# Patient Record
Sex: Female | Born: 1965 | Race: White | Hispanic: No | Marital: Single | Smoking: Never smoker
Health system: Southern US, Community
[De-identification: ages and names within clinical notes are randomized; demographics above are authoritative.]

## PROBLEM LIST (undated history)

## (undated) DIAGNOSIS — E039 Hypothyroidism, unspecified: Secondary | ICD-10-CM

## (undated) DIAGNOSIS — E785 Hyperlipidemia, unspecified: Secondary | ICD-10-CM

## (undated) DIAGNOSIS — T7840XA Allergy, unspecified, initial encounter: Secondary | ICD-10-CM

## (undated) DIAGNOSIS — Z86010 Personal history of colonic polyps: Principal | ICD-10-CM

## (undated) HISTORY — DX: Personal history of colonic polyps: Z86.010

## (undated) HISTORY — DX: Hyperlipidemia, unspecified: E78.5

## (undated) HISTORY — DX: Hypothyroidism, unspecified: E03.9

## (undated) HISTORY — DX: Allergy, unspecified, initial encounter: T78.40XA

## (undated) HISTORY — PX: OOPHORECTOMY: SHX86

## (undated) HISTORY — PX: APPENDECTOMY: SHX54

## (undated) HISTORY — PX: CHOLECYSTECTOMY: SHX55

## (undated) HISTORY — PX: COLONOSCOPY: SHX174

---

## 1987-01-15 HISTORY — PX: OOPHORECTOMY: SHX86

## 2000-08-04 ENCOUNTER — Other Ambulatory Visit: Admission: RE | Admit: 2000-08-04 | Discharge: 2000-08-04 | Payer: Self-pay | Admitting: Obstetrics and Gynecology

## 2004-11-12 ENCOUNTER — Encounter: Admission: RE | Admit: 2004-11-12 | Discharge: 2004-11-12 | Payer: Self-pay | Admitting: Internal Medicine

## 2005-12-20 ENCOUNTER — Encounter: Admission: RE | Admit: 2005-12-20 | Discharge: 2005-12-20 | Payer: Self-pay | Admitting: Internal Medicine

## 2005-12-31 ENCOUNTER — Encounter: Admission: RE | Admit: 2005-12-31 | Discharge: 2005-12-31 | Payer: Self-pay | Admitting: Internal Medicine

## 2006-07-22 ENCOUNTER — Encounter: Admission: RE | Admit: 2006-07-22 | Discharge: 2006-07-22 | Payer: Self-pay | Admitting: Internal Medicine

## 2006-11-21 ENCOUNTER — Ambulatory Visit: Payer: Self-pay | Admitting: Internal Medicine

## 2006-12-03 ENCOUNTER — Ambulatory Visit: Payer: Self-pay | Admitting: Internal Medicine

## 2007-02-20 ENCOUNTER — Encounter: Admission: RE | Admit: 2007-02-20 | Discharge: 2007-02-20 | Payer: Self-pay | Admitting: Internal Medicine

## 2009-01-19 ENCOUNTER — Encounter: Admission: RE | Admit: 2009-01-19 | Discharge: 2009-01-19 | Payer: Self-pay | Admitting: Internal Medicine

## 2009-03-30 ENCOUNTER — Ambulatory Visit: Payer: Self-pay | Admitting: Oncology

## 2009-04-14 LAB — COMPREHENSIVE METABOLIC PANEL
AST: 23 U/L (ref 0–37)
Alkaline Phosphatase: 66 U/L (ref 39–117)
BUN: 16 mg/dL (ref 6–23)
Glucose, Bld: 88 mg/dL (ref 70–99)
Sodium: 139 mEq/L (ref 135–145)
Total Bilirubin: 0.4 mg/dL (ref 0.3–1.2)

## 2009-04-14 LAB — CBC WITH DIFFERENTIAL/PLATELET
Basophils Absolute: 0 10*3/uL (ref 0.0–0.1)
EOS%: 4.2 % (ref 0.0–7.0)
Eosinophils Absolute: 0.2 10*3/uL (ref 0.0–0.5)
HGB: 12.9 g/dL (ref 11.6–15.9)
MCV: 90 fL (ref 79.5–101.0)
MONO%: 8.5 % (ref 0.0–14.0)
NEUT#: 2.4 10*3/uL (ref 1.5–6.5)
RBC: 4.09 10*6/uL (ref 3.70–5.45)
RDW: 11.9 % (ref 11.2–14.5)
WBC: 4.1 10*3/uL (ref 3.9–10.3)
lymph#: 1.2 10*3/uL (ref 0.9–3.3)

## 2009-04-14 LAB — MORPHOLOGY
PLT EST: ADEQUATE
RBC Comments: NORMAL

## 2009-04-17 ENCOUNTER — Telehealth (INDEPENDENT_AMBULATORY_CARE_PROVIDER_SITE_OTHER): Payer: Self-pay | Admitting: *Deleted

## 2009-04-17 LAB — HEPATITIS B SURFACE ANTIGEN: Hepatitis B Surface Ag: NEGATIVE

## 2009-04-17 LAB — TSH: TSH: 4.043 u[IU]/mL (ref 0.350–4.500)

## 2009-04-17 LAB — HEPATITIS B SURFACE ANTIBODY,QUALITATIVE: Hep B S Ab: POSITIVE — AB

## 2009-04-17 LAB — VITAMIN B12: Vitamin B-12: 408 pg/mL (ref 211–911)

## 2009-04-17 LAB — HEPATITIS B CORE ANTIBODY, TOTAL: Hep B Core Total Ab: NEGATIVE

## 2009-06-08 ENCOUNTER — Ambulatory Visit: Payer: Self-pay | Admitting: Oncology

## 2009-06-09 LAB — CBC WITH DIFFERENTIAL/PLATELET
BASO%: 0.9 % (ref 0.0–2.0)
EOS%: 3.2 % (ref 0.0–7.0)
HCT: 33.7 % — ABNORMAL LOW (ref 34.8–46.6)
MCH: 31.8 pg (ref 25.1–34.0)
MCHC: 35.8 g/dL (ref 31.5–36.0)
MONO#: 0.4 10*3/uL (ref 0.1–0.9)
NEUT%: 55.9 % (ref 38.4–76.8)
RBC: 3.79 10*6/uL (ref 3.70–5.45)
RDW: 12.1 % (ref 11.2–14.5)
WBC: 3.8 10*3/uL — ABNORMAL LOW (ref 3.9–10.3)
lymph#: 1.1 10*3/uL (ref 0.9–3.3)

## 2009-06-09 LAB — CHCC SMEAR

## 2009-08-02 ENCOUNTER — Ambulatory Visit: Payer: Self-pay | Admitting: Oncology

## 2009-08-04 LAB — CBC WITH DIFFERENTIAL/PLATELET
BASO%: 0.2 % (ref 0.0–2.0)
EOS%: 2.3 % (ref 0.0–7.0)
Eosinophils Absolute: 0.1 10*3/uL (ref 0.0–0.5)
LYMPH%: 26.8 % (ref 14.0–49.7)
MCHC: 35.8 g/dL (ref 31.5–36.0)
MCV: 89 fL (ref 79.5–101.0)
MONO%: 10.8 % (ref 0.0–14.0)
NEUT#: 2.2 10*3/uL (ref 1.5–6.5)
Platelets: 247 10*3/uL (ref 145–400)
RBC: 3.95 10*6/uL (ref 3.70–5.45)
RDW: 11.9 % (ref 11.2–14.5)

## 2009-08-04 LAB — CHCC SMEAR

## 2009-09-27 ENCOUNTER — Ambulatory Visit: Payer: Self-pay | Admitting: Oncology

## 2010-01-24 ENCOUNTER — Ambulatory Visit: Payer: Self-pay | Admitting: Oncology

## 2010-01-25 ENCOUNTER — Encounter
Admission: RE | Admit: 2010-01-25 | Discharge: 2010-01-25 | Payer: Self-pay | Source: Home / Self Care | Attending: Internal Medicine | Admitting: Internal Medicine

## 2010-01-26 LAB — CBC WITH DIFFERENTIAL/PLATELET
BASO%: 0.4 % (ref 0.0–2.0)
Basophils Absolute: 0 10*3/uL (ref 0.0–0.1)
EOS%: 2 % (ref 0.0–7.0)
Eosinophils Absolute: 0.1 10*3/uL (ref 0.0–0.5)
HCT: 35.1 % (ref 34.8–46.6)
HGB: 12.3 g/dL (ref 11.6–15.9)
LYMPH%: 24.7 % (ref 14.0–49.7)
MCH: 31.1 pg (ref 25.1–34.0)
MCHC: 34.9 g/dL (ref 31.5–36.0)
MCV: 89.3 fL (ref 79.5–101.0)
MONO#: 0.4 10*3/uL (ref 0.1–0.9)
MONO%: 7.9 % (ref 0.0–14.0)
NEUT#: 2.9 10*3/uL (ref 1.5–6.5)
NEUT%: 65 % (ref 38.4–76.8)
Platelets: 253 10*3/uL (ref 145–400)
RBC: 3.93 10*6/uL (ref 3.70–5.45)
RDW: 11.9 % (ref 11.2–14.5)
WBC: 4.5 10*3/uL (ref 3.9–10.3)
lymph#: 1.1 10*3/uL (ref 0.9–3.3)

## 2010-01-26 LAB — COMPREHENSIVE METABOLIC PANEL
ALT: 11 U/L (ref 0–35)
AST: 19 U/L (ref 0–37)
Albumin: 4.7 g/dL (ref 3.5–5.2)
Alkaline Phosphatase: 63 U/L (ref 39–117)
BUN: 22 mg/dL (ref 6–23)
CO2: 24 mEq/L (ref 19–32)
Calcium: 9.8 mg/dL (ref 8.4–10.5)
Chloride: 103 mEq/L (ref 96–112)
Creatinine, Ser: 0.76 mg/dL (ref 0.40–1.20)
Glucose, Bld: 88 mg/dL (ref 70–99)
Potassium: 4.2 mEq/L (ref 3.5–5.3)
Sodium: 137 mEq/L (ref 135–145)
Total Bilirubin: 0.4 mg/dL (ref 0.3–1.2)
Total Protein: 7.7 g/dL (ref 6.0–8.3)

## 2010-01-26 LAB — MORPHOLOGY: PLT EST: ADEQUATE

## 2010-01-26 LAB — CHCC SMEAR

## 2010-02-04 ENCOUNTER — Encounter: Payer: Self-pay | Admitting: Internal Medicine

## 2010-02-15 NOTE — Progress Notes (Signed)
  Phone Note Other Incoming   Request: Send information Summary of Call: Request received from [facility] forwarded to [facility].

## 2010-07-31 ENCOUNTER — Encounter (HOSPITAL_BASED_OUTPATIENT_CLINIC_OR_DEPARTMENT_OTHER): Payer: 59 | Admitting: Oncology

## 2010-07-31 ENCOUNTER — Other Ambulatory Visit: Payer: Self-pay | Admitting: Oncology

## 2010-07-31 DIAGNOSIS — D72819 Decreased white blood cell count, unspecified: Secondary | ICD-10-CM

## 2010-07-31 LAB — CBC WITH DIFFERENTIAL/PLATELET
Basophils Absolute: 0 10*3/uL (ref 0.0–0.1)
EOS%: 3 % (ref 0.0–7.0)
HCT: 34.6 % — ABNORMAL LOW (ref 34.8–46.6)
HGB: 12.1 g/dL (ref 11.6–15.9)
LYMPH%: 22 % (ref 14.0–49.7)
MCH: 31.2 pg (ref 25.1–34.0)
MCV: 89.6 fL (ref 79.5–101.0)
MONO%: 6.6 % (ref 0.0–14.0)
NEUT%: 67.8 % (ref 38.4–76.8)
Platelets: 238 10*3/uL (ref 145–400)

## 2011-01-18 ENCOUNTER — Other Ambulatory Visit: Payer: Self-pay | Admitting: Internal Medicine

## 2011-01-18 DIAGNOSIS — Z1231 Encounter for screening mammogram for malignant neoplasm of breast: Secondary | ICD-10-CM

## 2011-01-25 ENCOUNTER — Encounter: Payer: Self-pay | Admitting: Oncology

## 2011-01-25 ENCOUNTER — Ambulatory Visit (HOSPITAL_BASED_OUTPATIENT_CLINIC_OR_DEPARTMENT_OTHER): Payer: 59 | Admitting: Oncology

## 2011-01-25 ENCOUNTER — Other Ambulatory Visit: Payer: Self-pay | Admitting: Oncology

## 2011-01-25 ENCOUNTER — Other Ambulatory Visit (HOSPITAL_BASED_OUTPATIENT_CLINIC_OR_DEPARTMENT_OTHER): Payer: 59 | Admitting: Lab

## 2011-01-25 ENCOUNTER — Telehealth: Payer: Self-pay | Admitting: Oncology

## 2011-01-25 DIAGNOSIS — E039 Hypothyroidism, unspecified: Secondary | ICD-10-CM

## 2011-01-25 DIAGNOSIS — D72819 Decreased white blood cell count, unspecified: Secondary | ICD-10-CM

## 2011-01-25 DIAGNOSIS — Z862 Personal history of diseases of the blood and blood-forming organs and certain disorders involving the immune mechanism: Secondary | ICD-10-CM

## 2011-01-25 DIAGNOSIS — Z800 Family history of malignant neoplasm of digestive organs: Secondary | ICD-10-CM

## 2011-01-25 LAB — COMPREHENSIVE METABOLIC PANEL
Albumin: 4.3 g/dL (ref 3.5–5.2)
Alkaline Phosphatase: 69 U/L (ref 39–117)
BUN: 18 mg/dL (ref 6–23)
Calcium: 9.5 mg/dL (ref 8.4–10.5)
Glucose, Bld: 94 mg/dL (ref 70–99)
Potassium: 3.9 mEq/L (ref 3.5–5.3)

## 2011-01-25 LAB — CBC WITH DIFFERENTIAL/PLATELET
Basophils Absolute: 0 10*3/uL (ref 0.0–0.1)
EOS%: 2.4 % (ref 0.0–7.0)
HGB: 12.5 g/dL (ref 11.6–15.9)
MCH: 31.1 pg (ref 25.1–34.0)
MCHC: 34.9 g/dL (ref 31.5–36.0)
MCV: 89.2 fL (ref 79.5–101.0)
MONO%: 7.3 % (ref 0.0–14.0)
RBC: 4.01 10*6/uL (ref 3.70–5.45)
RDW: 12.4 % (ref 11.2–14.5)

## 2011-01-25 LAB — MORPHOLOGY

## 2011-01-25 NOTE — Telephone Encounter (Signed)
gve the referral for the genteic testing appt to [facility]

## 2011-01-25 NOTE — Progress Notes (Signed)
[facility] OFFICE PROGRESS NOTE  Cc:  Tally Due, MD, MD  DIAGNOSIS:   Intermittent leucopenia, NOS.  CURRENT THERAPY:  watchful observation.  INTERVAL HISTORY: Deborah Ramirez 46 y.o. female returns for regular follow up by herself.  With respect to her history of leukocytopenia, she is doing well.  She denies fever, night sweat, weight loss, bleeding symptoms, recurrent infection.  She works full time without fatigue.  Her younger sister died at age 19 from gastric cancer.  Her grandmother died at age 54 from colon cancer.  She inquires about her risk.  She eats very healthy, vegetable/fruit diet.  She does not eat much grilled or canned meat.  She denies abdominal pain, nausea/vomiting, hematemasis, hematochezia, melena, weight loss.   MEDICAL HISTORY: Past Medical History  Diagnosis Date  . Fibromyalgia   . Hyperlipidemia   . Endometriosis 1990  . Hypothyroidism   . Vitamin D deficiency     SURGICAL HISTORY: No past surgical history on file.  MEDICATIONS: Current Outpatient Prescriptions  Medication Sig Dispense Refill  . levothyroxine (SYNTHROID, LEVOTHROID) 75 MCG tablet Take 75 mcg by mouth daily.        ALLERGIES:   has no known allergies.  REVIEW OF SYSTEMS:  The rest of the 14-point review of system was negative.   Filed Vitals:   01/25/11 1517  BP: 120/75  Pulse: 69  Temp: 98.2 F (36.8 C)   Wt Readings from Last 3 Encounters:  01/25/11 157 lb (71.215 kg)   ECOG Performance status:   PHYSICAL EXAMINATION:   General:  well-nourished in no acute distress.  Eyes:  no scleral icterus.  ENT:  There were no oropharyngeal lesions.  Neck was without thyromegaly.  Lymphatics:  Negative cervical, supraclavicular or axillary adenopathy.  Respiratory: lungs were clear bilaterally without wheezing or crackles.  Cardiovascular:  Regular rate and rhythm, S1/S2, without murmur, rub or gallop.  There was no pedal edema.  GI:  abdomen was soft, flat,  nontender, nondistended, without organomegaly.  Muscoloskeletal:  no spinal tenderness of palpation of vertebral spine.  Skin exam was without echymosis, petichae.  Neuro exam was nonfocal.  Patient was able to get on and off exam table without assistance.  Gait was normal.  Patient was alerted and oriented.  Attention was good.   Language was appropriate.  Mood was normal without depression.  Speech was not pressured.  Thought content was not tangential.    LABORATORY/RADIOLOGY DATA:  Lab Results  Component Value Date   WBC 4.3 01/25/2011   HGB 12.5 01/25/2011   HCT 35.7 01/25/2011   PLT 278 01/25/2011   GLUCOSE 94 01/25/2011   ALT 29 01/25/2011   AST 24 01/25/2011   NA 139 01/25/2011   K 3.9 01/25/2011   CL 103 01/25/2011   CREATININE 0.75 01/25/2011   BUN 18 01/25/2011   CO2 26 01/25/2011   TSH 4.043 04/14/2009     I personally reviewed the patient's peripheral blood smear today.  There was isocytosis.  There was no peripheral blast.  There was no schistocytosis, spherocytosis, target cell, rouleaux formation, tear drop cell.  There was no giant platelets or platelet clumps.     ASSESSMENT AND PLAN:   1. History of leukocytopenia:  Possibly related to her hypothyroidism.  With correction of this issue, her leukocytopenia has resolved.  I have low clinical concern for a primary bone marrow failure process.  She does not have any cytopenia.  My review of her peripheral  blood smear was quite normal today.  I advised her to follow up with her PCP at least twice a year.  In the future, if her cytopenia recurs, I may consider further work up.  2. Hypothyroidism:  She is on levothyroxine per PCP.   3. Family history with sister with gastric CA and grandmother with colon CA.  There was no other family history of cancer.  My opinion is that these two relatives have sporadic cancer as opposed to hereditary syndrome.  I reviewed the Bethesda guidelines and they do not meet criteria for Lynch syndrome.   However, this guideline is quite stringent and that sometimes cases may be missed.  The concern I have is that her sister died from gastric cancer at age 76.  I thus recommended a referral to Genetic Counselor clinic to see whether patient should be screened for Lynch syndrome.  Unfortunately, the two index cases in her family had passed away and did not have testing which would have been ideal.  Patient herself has had screening colonoscopy in the past which was negative.  I advised her about red flag symptoms for gastric cancer including abdominal pain, nonintentional weight loss, hematemasis, melena, hematochezia which she all declined at this time.  I discussed with her that there is no indication for screening EGD for gastric cancer unless she has symptoms.  She expressed understanding.    The length of time of the face-to-face encounter was 20 minutes. More than 50% of time was spent counseling and coordination of care.

## 2011-01-29 ENCOUNTER — Ambulatory Visit
Admission: RE | Admit: 2011-01-29 | Discharge: 2011-01-29 | Disposition: A | Discharge status: Home / Self Care | Payer: 59 | Source: Ambulatory Visit | Attending: Internal Medicine | Admitting: Internal Medicine

## 2011-01-29 ENCOUNTER — Telehealth: Payer: Self-pay | Admitting: Oncology

## 2011-01-29 DIAGNOSIS — Z1231 Encounter for screening mammogram for malignant neoplasm of breast: Secondary | ICD-10-CM

## 2011-01-29 NOTE — Telephone Encounter (Signed)
Received copy of genetics referral from [facility] to schedule appt for pt. S/w pt today re appt for 1/18 @ 11 am.

## 2011-02-22 ENCOUNTER — Ambulatory Visit: Payer: 59

## 2011-02-22 NOTE — Progress Notes (Signed)
Pt seen for genetic counseling.  Blood drawn for Lynch syndrome

## 2011-02-26 ENCOUNTER — Other Ambulatory Visit: Payer: 59 | Admitting: Lab

## 2011-02-26 ENCOUNTER — Ambulatory Visit: Payer: 59

## 2011-04-08 ENCOUNTER — Ambulatory Visit (INDEPENDENT_AMBULATORY_CARE_PROVIDER_SITE_OTHER): Payer: 59 | Admitting: Physician Assistant

## 2011-04-08 VITALS — BP 134/87 | HR 69 | Temp 98.3°F | Resp 16 | Ht 60.0 in | Wt 144.0 lb

## 2011-04-08 DIAGNOSIS — Z800 Family history of malignant neoplasm of digestive organs: Secondary | ICD-10-CM

## 2011-04-08 DIAGNOSIS — E039 Hypothyroidism, unspecified: Secondary | ICD-10-CM

## 2011-04-08 MED ORDER — LEVOTHYROXINE SODIUM 75 MCG PO TABS: 75.0000 ug | ORAL_TABLET | Freq: Every day | ORAL | Status: DC

## 2011-04-08 NOTE — Progress Notes (Signed)
  Subjective:    Patient ID: Deborah Ramirez, female    DOB: 05-13-1965, 45 y.o.   MRN: [id number]  HPI Littie is here today for regular recheck hypothyroidism.  She has no concerns today.  Had annual Theatre stage manager PE and brings results with her today.  Cholesterol has increased since last year.  She has not changed her diet or exercise and watches both things closely.  Saw Hematology for leukocytosis.  No treatment advised.  He felt this was due to hypothyroidism.  TSH January 2013 was slightly elevated at 6.950.  We will repeat today.   Review of Systems  Constitutional: Negative for activity change, appetite change and unexpected weight change.  Respiratory: Negative for chest tightness and shortness of breath.   Cardiovascular: Negative for chest pain and palpitations.  Gastrointestinal: Negative for vomiting.  Musculoskeletal: Negative for myalgias.  Neurological: Negative for dizziness, light-headedness and headaches.       Objective:   Physical Exam  Constitutional: She is oriented to person, place, and time. She appears well-developed and well-nourished.  Neck: Neck supple. No thyromegaly present.  Cardiovascular: Normal rate and regular rhythm.   Neurological: She is alert and oriented to person, place, and time.        Assessment & Plan:  Hypothyroidism  Repeat TSH from January 2013.  Refill Synthroid 75 mcg 3 mo supply with 3 RF.  Recheck annually or if any changes.  Anticipatory guidance.

## 2011-06-20 ENCOUNTER — Ambulatory Visit (INDEPENDENT_AMBULATORY_CARE_PROVIDER_SITE_OTHER): Payer: 59 | Admitting: Physician Assistant

## 2011-06-20 ENCOUNTER — Ambulatory Visit: Payer: 59

## 2011-06-20 VITALS — BP 112/73 | HR 67 | Temp 98.5°F | Resp 16 | Ht 60.0 in | Wt 140.0 lb

## 2011-06-20 DIAGNOSIS — S6990XA Unspecified injury of unspecified wrist, hand and finger(s), initial encounter: Secondary | ICD-10-CM

## 2011-06-20 DIAGNOSIS — S6980XA Other specified injuries of unspecified wrist, hand and finger(s), initial encounter: Secondary | ICD-10-CM

## 2011-06-20 DIAGNOSIS — S6000XA Contusion of unspecified finger without damage to nail, initial encounter: Secondary | ICD-10-CM

## 2011-06-20 NOTE — Progress Notes (Signed)
  Subjective:    Patient ID: Deborah Ramirez, female    DOB: 22-Feb-1965, 46 y.o.   MRN: [id number]  HPI Iara comes in tonight with injury to left index after hitting it with a hammer 3 days ago.  She has had pain and bruising.  Some distal numbness but it is improving.     Review of Systems As noted in HPI    Objective:   Physical Exam  Constitutional: She is oriented to person, place, and time. Vital signs are normal.  Musculoskeletal:       Hands:      Left 2nd phalanx with mid to distal ecchymosis noted dorsal and ulnar aspect of digit.  Decreased flexion from swelling.  Tender at DIP and mid phalanx.  NV intact  Neurological: She is alert and oriented to person, place, and time.  Skin: Ecchymosis noted.    [facility] reading (PRIMARY) by  Dr. Hal Hope.  Left hand,  2nd phalanx No fx     Assessment & Plan:  Contusion, 2nd phalanx of left hand  She has a long fold over splint she has been using with Coban that she can continue to use for comfort.  Continue to Ice and take Advil prn.  RTC if no improvement 1 week.

## 2011-06-21 NOTE — Progress Notes (Unsigned)
Erroneous encounter

## 2011-06-23 ENCOUNTER — Telehealth: Payer: Self-pay | Admitting: Radiology

## 2011-06-23 NOTE — Telephone Encounter (Signed)
Notified pt of msg.

## 2011-06-23 NOTE — Telephone Encounter (Signed)
Message copied by Luretha Murphy on Sun Jun 23, 2011 10:06 AM ------      Message from: Pattricia Boss      Created: Fri Jun 21, 2011  1:55 PM       Call and notify that the radiology agreed that her finger is not fractured.

## 2011-07-24 ENCOUNTER — Other Ambulatory Visit: Payer: 59

## 2011-08-19 ENCOUNTER — Ambulatory Visit: Payer: 59

## 2011-08-23 ENCOUNTER — Ambulatory Visit (INDEPENDENT_AMBULATORY_CARE_PROVIDER_SITE_OTHER): Payer: 59 | Admitting: Physician Assistant

## 2011-08-23 VITALS — BP 111/69 | HR 65 | Temp 98.1°F | Resp 16 | Ht 60.0 in | Wt 138.2 lb

## 2011-08-23 DIAGNOSIS — Z01419 Encounter for gynecological examination (general) (routine) without abnormal findings: Secondary | ICD-10-CM

## 2011-08-23 DIAGNOSIS — E039 Hypothyroidism, unspecified: Secondary | ICD-10-CM

## 2011-08-23 DIAGNOSIS — E785 Hyperlipidemia, unspecified: Secondary | ICD-10-CM

## 2011-08-23 DIAGNOSIS — D72819 Decreased white blood cell count, unspecified: Secondary | ICD-10-CM

## 2011-08-23 NOTE — Progress Notes (Signed)
  Subjective:    Patient ID: Deborah Ramirez, female    DOB: 1965/10/28, 46 y.o.   MRN: [id number]  HPI Deborah Ramirez comes in tonight for her annual GYN exam.  She has no concerns.   She had her MMG in January 2013 and was normal. Her last pap has been > than 3 years. She lives with her long time partner, [name], and has never been pregnant.  She is not sexually active with men.  She performs SBE when she remembers.   She had PE with labs Jan 2013 for [employer].  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       Review of Systems  Constitutional: Negative.   Respiratory: Negative.   Cardiovascular: Negative.   Genitourinary: Negative.   Skin: Negative.   Hematological: Negative.   Psychiatric/Behavioral: Negative.        Objective:   Physical Exam  Constitutional: She is oriented to person, place, and time. Vital signs are normal. She appears well-developed and well-nourished.  Neck: No mass and no thyromegaly present.  Cardiovascular: Normal rate and regular rhythm.   Pulmonary/Chest: Effort normal and breath sounds normal.  Abdominal: Normal appearance and bowel sounds are normal. There is no tenderness.  Genitourinary: Vagina normal and uterus normal. No breast swelling, tenderness, discharge or bleeding. Cervix exhibits no motion tenderness. Right adnexum displays no tenderness. Left adnexum displays no tenderness.  Lymphadenopathy:    She has no cervical adenopathy.    She has no axillary adenopathy.  Neurological: She is alert and oriented to person, place, and time.  Skin: Skin is warm. No rash noted.  Psychiatric: She has a normal mood and affect.        Assessment & Plan:  Annual GYN Exam Hypothyroidism Hyperlipidemia Leukopenia  Pap # 2 Recommend she  return when fasting to repeat Lipids to monitor progress of diet changes (office visit not necessary). Will enter order for lipid panel. Anticipatory guidance.

## 2011-08-25 ENCOUNTER — Encounter: Payer: Self-pay | Admitting: Physician Assistant

## 2011-08-25 ENCOUNTER — Other Ambulatory Visit (INDEPENDENT_AMBULATORY_CARE_PROVIDER_SITE_OTHER): Payer: 59 | Admitting: Physician Assistant

## 2011-08-25 VITALS — BP 109/69 | HR 65 | Resp 16

## 2011-08-25 DIAGNOSIS — E785 Hyperlipidemia, unspecified: Secondary | ICD-10-CM

## 2011-08-25 DIAGNOSIS — I10 Essential (primary) hypertension: Secondary | ICD-10-CM

## 2011-08-25 DIAGNOSIS — D72819 Decreased white blood cell count, unspecified: Secondary | ICD-10-CM | POA: Insufficient documentation

## 2011-08-25 DIAGNOSIS — E039 Hypothyroidism, unspecified: Secondary | ICD-10-CM | POA: Insufficient documentation

## 2011-08-25 LAB — LIPID PANEL
LDL Cholesterol: 113 mg/dL — ABNORMAL HIGH (ref 0–99)
VLDL: 14 mg/dL (ref 0–40)

## 2011-08-26 LAB — PAP IG W/ RFLX HPV ASCU

## 2011-10-25 ENCOUNTER — Telehealth: Payer: Self-pay | Admitting: Internal Medicine

## 2011-10-28 NOTE — Telephone Encounter (Signed)
I do not think I have any info on this patient - is there a chart - paper records? Is she my patient?

## 2011-10-28 NOTE — Telephone Encounter (Signed)
Pt had colon with Dr. Leone Payor on 12/03/06. Chart has been sent off-site.

## 2011-10-29 NOTE — Telephone Encounter (Signed)
Patient had a normal colonoscopy 12/03/2006. She has a history according to the report of family history of colon cancer in a grandparent.  I have left a message for the patient to call back to discuss.

## 2011-10-30 NOTE — Telephone Encounter (Signed)
Patient reports that her sister passed away of gastric CA in her 61's and a grandparent with colon CA.  She has no current problems.  She wanted her chart reviewed to see if she needs to have colon earlier.  I will place a copy of the colon in your office.

## 2011-10-31 NOTE — Telephone Encounter (Signed)
No change in recall needed

## 2011-10-31 NOTE — Telephone Encounter (Signed)
I have left a detailed message for the patient with colonoscopy recall information.  I have asked that she call back for any additional questions or concerns

## 2011-11-05 ENCOUNTER — Encounter: Payer: Self-pay | Admitting: Internal Medicine

## 2012-03-13 ENCOUNTER — Other Ambulatory Visit: Payer: Self-pay

## 2012-03-13 DIAGNOSIS — Z1231 Encounter for screening mammogram for malignant neoplasm of breast: Secondary | ICD-10-CM

## 2012-04-08 ENCOUNTER — Ambulatory Visit
Admission: RE | Admit: 2012-04-08 | Discharge: 2012-04-08 | Disposition: A | Discharge status: Home / Self Care | Payer: 59 | Source: Ambulatory Visit

## 2012-04-08 DIAGNOSIS — Z1231 Encounter for screening mammogram for malignant neoplasm of breast: Secondary | ICD-10-CM

## 2012-07-04 ENCOUNTER — Other Ambulatory Visit: Payer: Self-pay | Admitting: Physician Assistant

## 2012-07-07 ENCOUNTER — Ambulatory Visit (INDEPENDENT_AMBULATORY_CARE_PROVIDER_SITE_OTHER): Payer: 59 | Admitting: Physician Assistant

## 2012-07-07 VITALS — BP 119/78 | HR 62 | Temp 98.2°F | Resp 16 | Ht 59.0 in | Wt 145.0 lb

## 2012-07-07 DIAGNOSIS — E039 Hypothyroidism, unspecified: Secondary | ICD-10-CM

## 2012-07-07 MED ORDER — LEVOTHYROXINE SODIUM 75 MCG PO TABS: 75.0000 ug | ORAL_TABLET | Freq: Every day | ORAL | Status: DC

## 2012-07-07 NOTE — Progress Notes (Signed)
   Patient ID: Deborah Ramirez MRN: [id number], DOB: 1965/04/17, 47 y.o. Date of Encounter: 07/07/2012, 8:15 PM  Primary Physician: Tally Due, MD  Chief Complaint: Medication refill   HPI: 47 y.o. year old female with history below presents for refill of levothyroxine 75 mcg. Takes daily without issues. Has been on this dose for as long as she can remember. TSH has been stable for at least 3 years. No adverse effects. Asymptomatic. Doing well without issues or complaints. Gets her blood drawn every January through the fire department and brings this by here.    Past Medical History  Diagnosis Date  . Fibromyalgia   . Hyperlipidemia   . Endometriosis 1990  . Hypothyroidism   . Vitamin D deficiency      Home Meds: Prior to Admission medications   Medication Sig Start Date End Date Taking? Authorizing Provider  levothyroxine (SYNTHROID, LEVOTHROID) 75 MCG tablet Take 1 tablet (75 mcg total) by mouth daily before breakfast. 07/07/12  Yes Sondra Barges, PA-C    Allergies: No Known Allergies  History   Social History  . Marital Status: Single    Spouse Name: N/A    Number of Children: N/A  . Years of Education: N/A   Occupational History  .  [employer]    Emergency planning/management officer   Social History Main Topics  . Smoking status: Never Smoker   . Smokeless tobacco: Not on file  . Alcohol Use: Not on file  . Drug Use: Not on file  . Sexually Active: Not on file     Comment: SSP   Other Topics Concern  . Not on file   Social History Narrative  . No narrative on file     Review of Systems: Per HPI   Physical Exam: Blood pressure 119/78, pulse 62, temperature 98.2 F (36.8 C), resp. rate 16, height 4\' 11"  (1.499 m), weight 145 lb (65.772 kg)., Body mass index is 29.27 kg/(m^2). General: Well developed, well nourished, in no acute distress. Head: Normocephalic, atraumatic, eyes without discharge, sclera non-icteric, nares are without discharge. Bilateral auditory  canals clear, TM's are without perforation, pearly grey and translucent with reflective cone of light bilaterally. Oral cavity moist, posterior pharynx without exudate, erythema, peritonsillar abscess, or post nasal drip.  Neck: Supple. No thyromegaly or thyroid nodules. Full ROM. No lymphadenopathy. Lungs: Clear bilaterally to auscultation without wheezes, rales, or rhonchi. Breathing is unlabored. Heart: RRR with S1 S2. No murmurs, rubs, or gallops appreciated. Msk:  Strength and tone normal for age. Extremities/Skin: Warm and dry. No clubbing or cyanosis. No edema. No rashes or suspicious lesions. Neuro: Alert and oriented X 3. Moves all extremities spontaneously. Gait is normal. CNII-XII grossly in tact. Psych:  Responds to questions appropriately with a normal affect.   Labs: TSH pending  ASSESSMENT AND PLAN:  47 y.o. female with hypothyroidism here for medication refill. -Well controlled -Refilled levothyroxine 75 mcg 1 po daily #90 RF 3 -Await TSH -Patient to bring by labs drawn by fire department for abstracting  Signed, Eula Listen, PA-C 07/07/2012 8:15 PM

## 2012-07-08 LAB — TSH: TSH: 3.465 u[IU]/mL (ref 0.350–4.500)

## 2012-11-03 IMAGING — MG MM DIGITAL SCREENING BILAT
4 series · 4 of 4 positions shown · non-contrast
Comparison: none

DG SCREEN MAMMOGRAM BILATERAL
Bilateral CC and MLO view(s) were taken.

DIGITAL SCREENING MAMMOGRAM WITH CAD:
The breast tissue is heterogeneously dense.  No masses or malignant type calcifications are 
identified.  Compared with prior studies.
Images were processed with CAD.

[R CC]
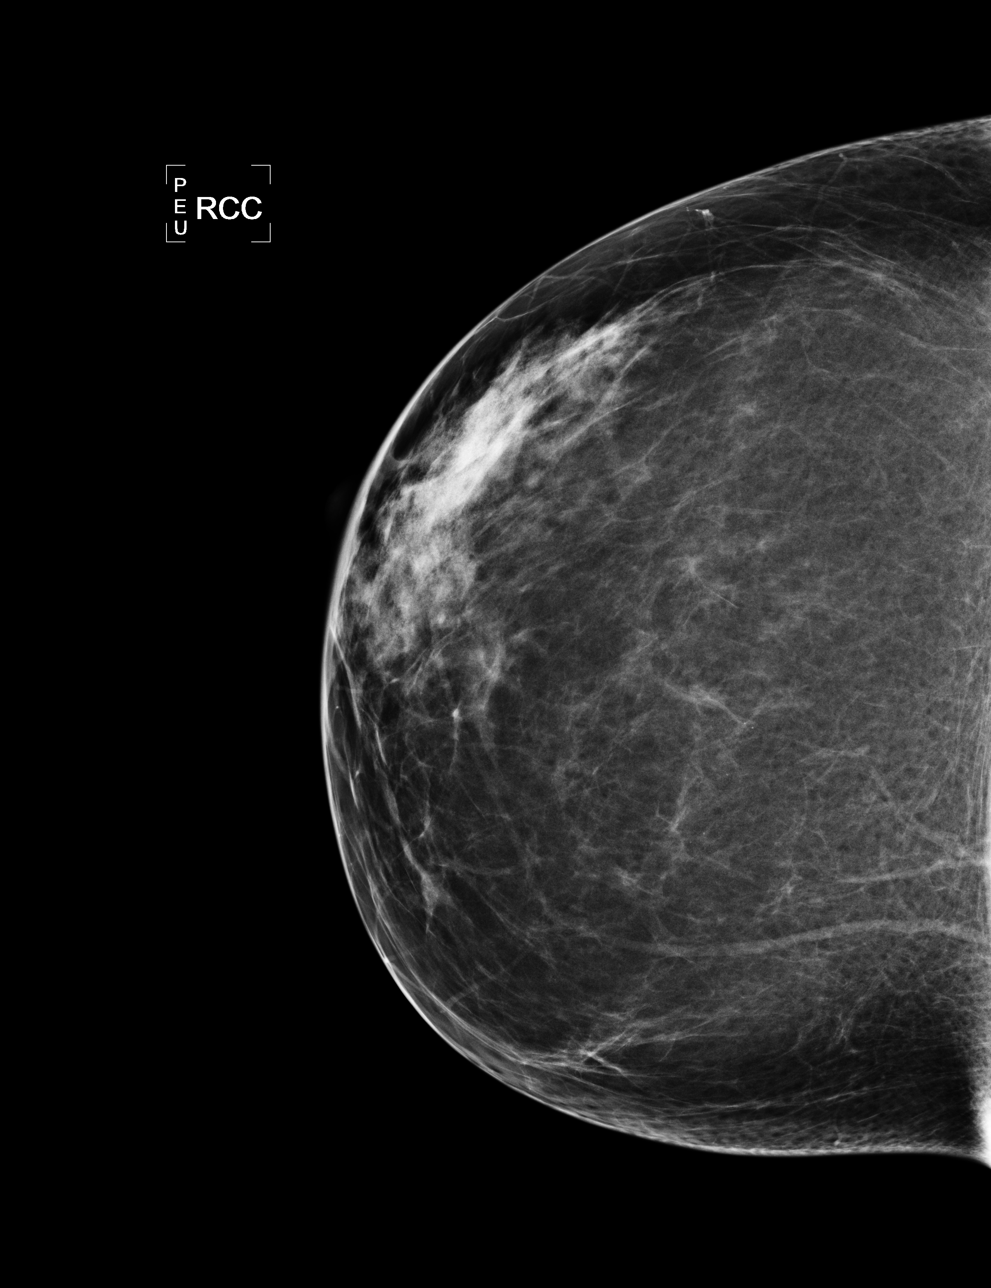

[L CC]
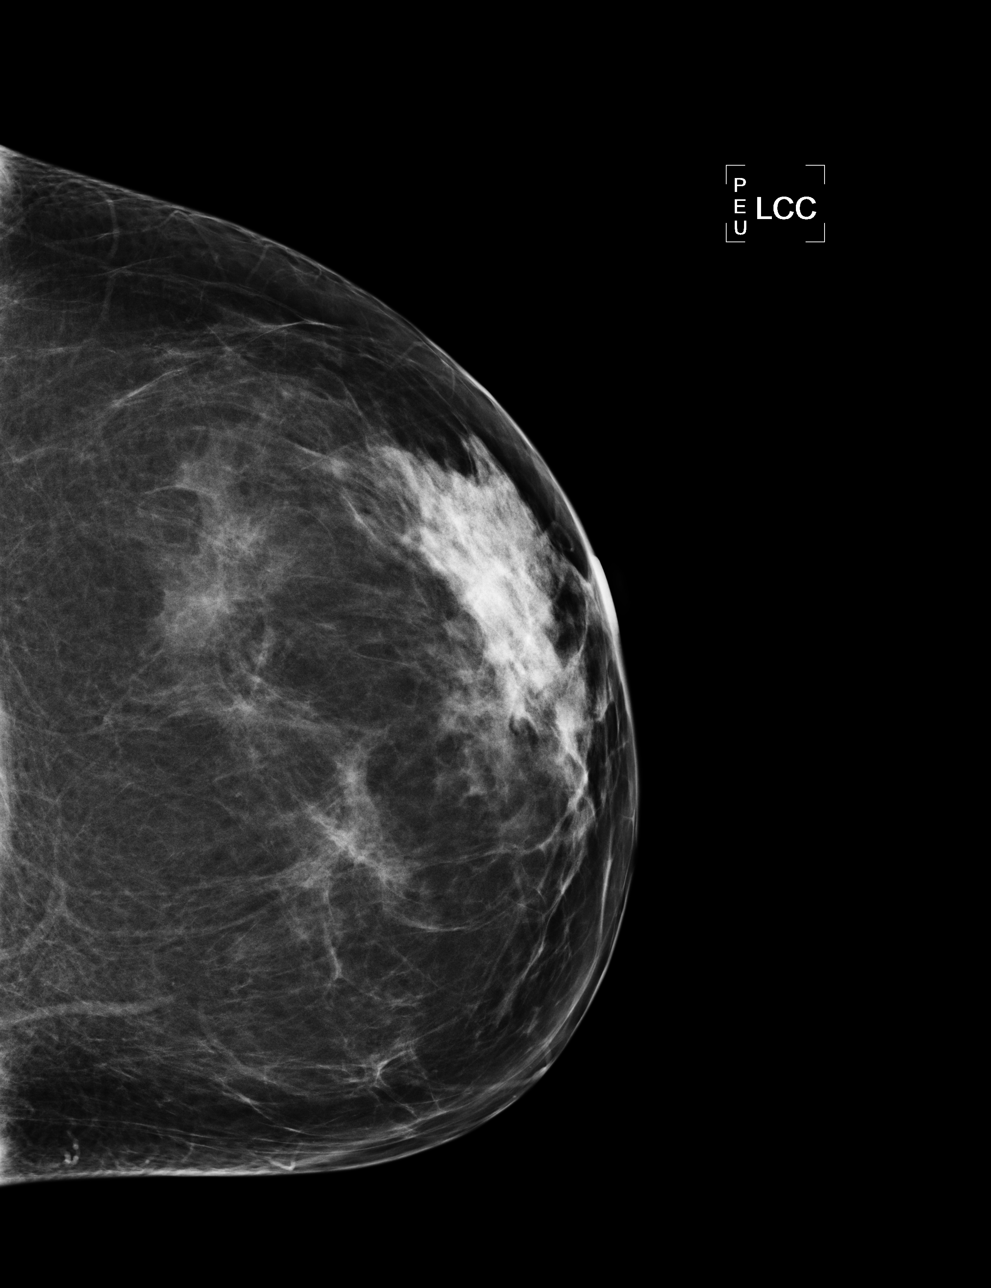

[R MLO (1 of 2)]
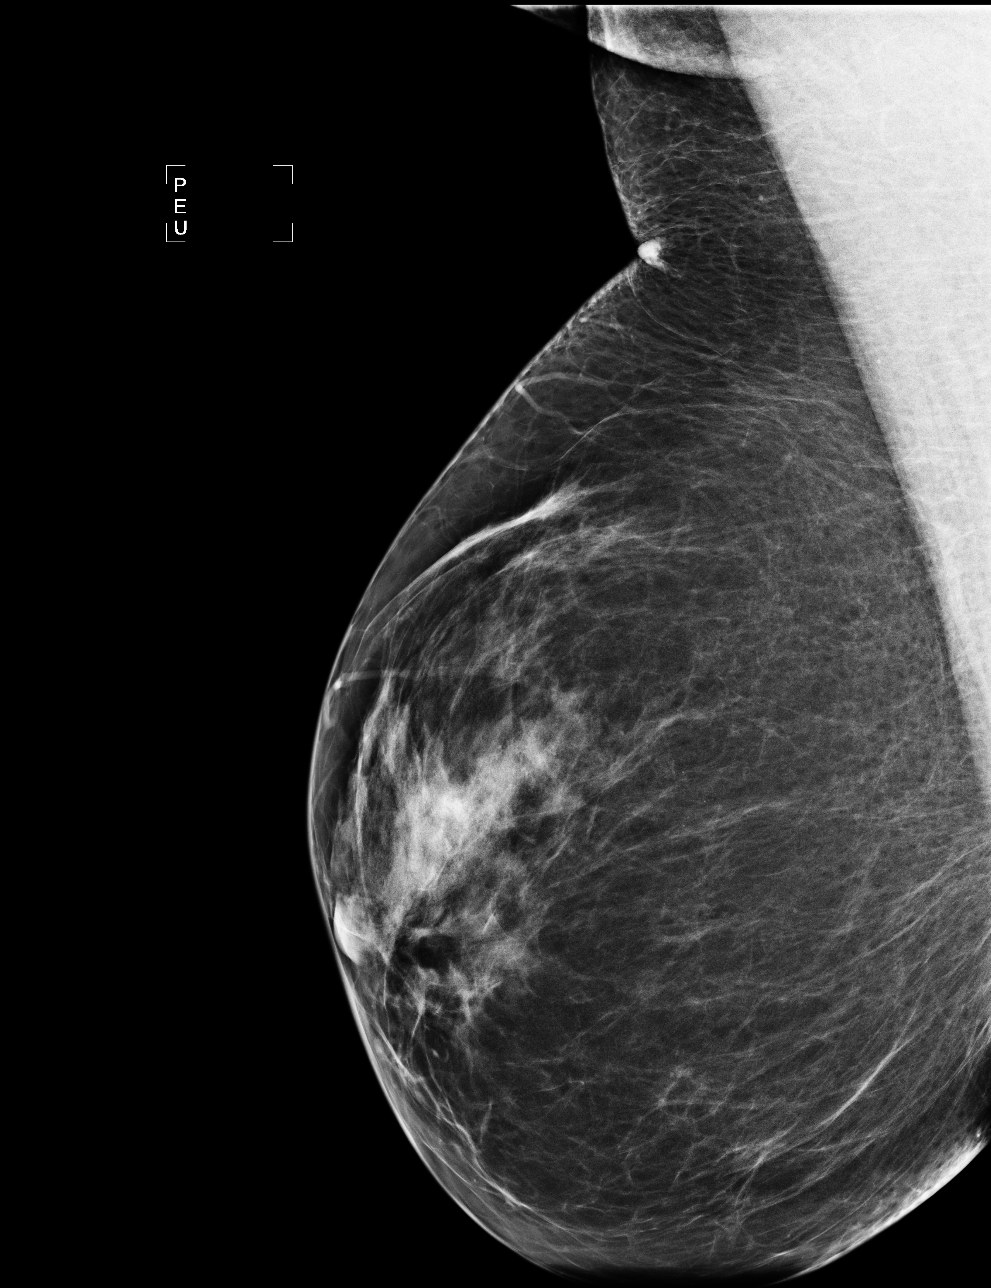

[R MLO (2 of 2)]
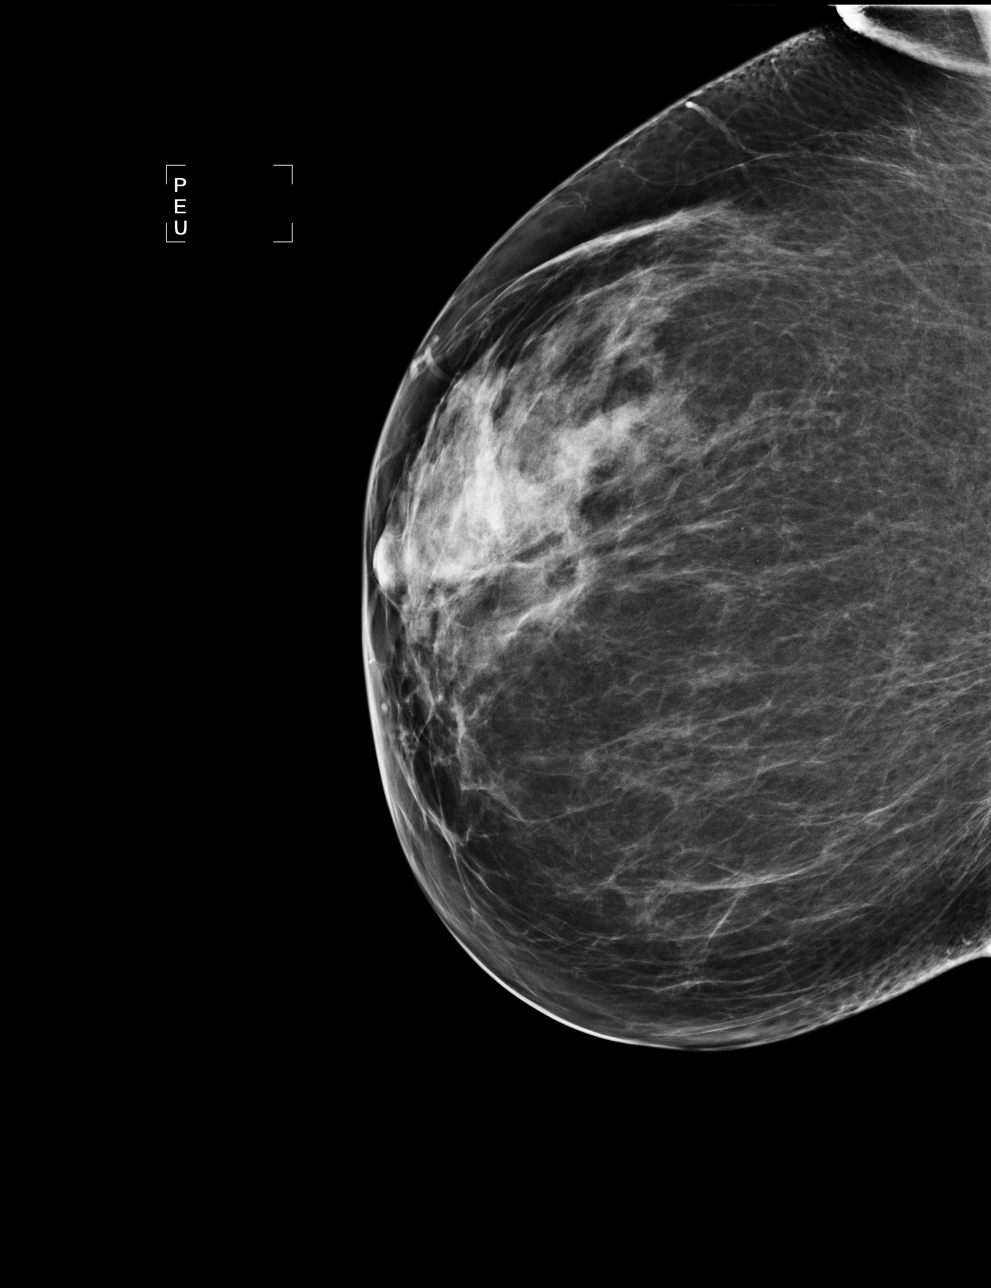

[4 of 4 positions shown; findings below may reference images not displayed]

IMPRESSION: No specific mammographic evidence of malignancy.  Next screening mammogram is recommended in one 
year.

A result letter of this screening mammogram will be mailed directly to the patient.

ASSESSMENT: Negative - BI-RADS 1

Screening mammogram in 1 year.
,

## 2013-01-30 LAB — LIPID PANEL
Cholesterol: 247 mg/dL — AB (ref 0–200)
HDL: 78 mg/dL — AB (ref 35–70)
LDL CALC: 147 mg/dL
Triglycerides: 108 mg/dL (ref 40–160)

## 2013-01-30 LAB — BASIC METABOLIC PANEL
BUN: 19 mg/dL (ref 4–21)
Creatinine: 0.9 mg/dL (ref 0.5–1.1)
GLUCOSE: 93 mg/dL
POTASSIUM: 4.2 mmol/L (ref 3.4–5.3)
SODIUM: 139 mmol/L (ref 137–147)

## 2013-01-30 LAB — CBC AND DIFFERENTIAL
HEMATOCRIT: 36 % (ref 36–46)
Hemoglobin: 12.4 g/dL (ref 12.0–16.0)
Platelets: 295 10*3/uL (ref 150–399)
WBC: 3.3 10*3/mL

## 2013-01-30 LAB — TSH: TSH: 8.89 u[IU]/mL — AB (ref 0.41–5.90)

## 2013-01-30 LAB — HEPATIC FUNCTION PANEL
ALT: 25 U/L (ref 7–35)
AST: 19 U/L (ref 13–35)
Alkaline Phosphatase: 66 U/L (ref 25–125)
BILIRUBIN, TOTAL: 0.2 mg/dL

## 2013-07-14 ENCOUNTER — Other Ambulatory Visit: Payer: Self-pay | Admitting: Physician Assistant

## 2013-07-27 ENCOUNTER — Encounter: Payer: Self-pay | Admitting: Physician Assistant

## 2013-08-17 ENCOUNTER — Ambulatory Visit (INDEPENDENT_AMBULATORY_CARE_PROVIDER_SITE_OTHER): Payer: 59 | Admitting: Family Medicine

## 2013-08-17 VITALS — BP 120/70 | HR 60 | Temp 98.1°F | Resp 16 | Ht 60.0 in | Wt 143.0 lb

## 2013-08-17 DIAGNOSIS — E039 Hypothyroidism, unspecified: Secondary | ICD-10-CM

## 2013-08-17 MED ORDER — LEVOTHYROXINE SODIUM 75 MCG PO TABS: 75.0000 ug | ORAL_TABLET | Freq: Every day | ORAL | Status: DC

## 2013-08-17 NOTE — Progress Notes (Signed)
   Subjective:    Patient ID: Deborah Ramirez, female    DOB: [birth year], 48 y.o.   MRN: [id number]  HPI 48 y.o. year old female with PMHx of hypothyroidism presents for follow-up of hypothyroidism. She is due for a refill of levothyroxine 75 mcg. Takes daily without issues. TSH has been stable for at least 3 years. No adverse effects. Gets her blood drawn every January through the fire department and brings this by here. She is due for a lab draw today. She inquires whether she can have just 1 physical (rather than with the FD and then here for med refill). She denies any LE edema, shortness of breath, fatigue, cold intolerance, hair thinning, chest pain, or palpitaitons.  Past Medical History  Diagnosis Date  . Fibromyalgia   . Hyperlipidemia   . Endometriosis 1990  . Hypothyroidism   . Vitamin D deficiency    History  Substance Use Topics  . Smoking status: Never Smoker   . Smokeless tobacco: Not on file  . Alcohol Use: Not on file     Review of Systems As per HPI.     Objective:   Physical Exam BP 120/70  Pulse 60  Temp(Src) 98.1 F (36.7 C)  Resp 16  Ht 5' (1.524 m)  Wt 143 lb (64.864 kg)  BMI 27.93 kg/m2  SpO2 97% General appearance: alert, cooperative and appears stated age Head: Normocephalic, without obvious abnormality, atraumatic Eyes: conjunctivae/corneas clear. PERRL, EOM's intact. Fundi benign. Throat: lips, mucosa, and tongue normal; teeth and gums normal Neck: no adenopathy, no carotid bruit, no JVD, supple, symmetrical, trachea midline and thyroid not enlarged, symmetric, no tenderness/mass/nodules Lungs: clear to auscultation bilaterally Heart: regular rate and rhythm, S1, S2 normal, no murmur, click, rub or gallop Extremities: extremities normal, atraumatic, no cyanosis or edema Skin: Skin color, texture, turgor normal. No rashes or lesions     Assessment & Plan:  1. Hypothyroidism -Asymptomatic -Refilled levothyroxine 78mcg -Ordered thyroid  function panel -Will follow-up results of thyroid panel -She will plan to follow-up in January for her annual physical with the Fire Department or sooner if needed.  Natividad Brood, DO Sports Medicine Fellow

## 2013-08-17 NOTE — Patient Instructions (Signed)
Hypothyroidism The thyroid is a large gland located in the lower front of your neck. The thyroid gland helps control metabolism. Metabolism is how your body handles food. It controls metabolism with the hormone thyroxine. When this gland is underactive (hypothyroid), it produces too little hormone.  CAUSES These include:   Absence or destruction of thyroid tissue.  Goiter due to iodine deficiency.  Goiter due to medications.  Congenital defects (since birth).  Problems with the pituitary. This causes a lack of TSH (thyroid stimulating hormone). This hormone tells the thyroid to turn out more hormone. SYMPTOMS  Lethargy (feeling as though you have no energy)  Cold intolerance  Weight gain (in spite of normal food intake)  Dry skin  Coarse hair  Menstrual irregularity (if severe, may lead to infertility)  Slowing of thought processes Cardiac problems are also caused by insufficient amounts of thyroid hormone. Hypothyroidism in the newborn is cretinism, and is an extreme form. It is important that this form be treated adequately and immediately or it will lead rapidly to retarded physical and mental development. DIAGNOSIS  To prove hypothyroidism, your caregiver may do blood tests and ultrasound tests. Sometimes the signs are hidden. It may be necessary for your caregiver to watch this illness with blood tests either before or after diagnosis and treatment. TREATMENT  Low levels of thyroid hormone are increased by using synthetic thyroid hormone. This is a safe, effective treatment. It usually takes about four weeks to gain the full effects of the medication. After you have the full effect of the medication, it will generally take another four weeks for problems to leave. Your caregiver may start you on low doses. If you have had heart problems the dose may be gradually increased. It is generally not an emergency to get rapidly to normal. HOME CARE INSTRUCTIONS   Take your  medications as your caregiver suggests. Let your caregiver know of any medications you are taking or start taking. Your caregiver will help you with dosage schedules.  As your condition improves, your dosage needs may increase. It will be necessary to have continuing blood tests as suggested by your caregiver.  Report all suspected medication side effects to your caregiver. SEEK MEDICAL CARE IF: Seek medical care if you develop:  Sweating.  Tremulousness (tremors).  Anxiety.  Rapid weight loss.  Heat intolerance.  Emotional swings.  Diarrhea.  Weakness. SEEK IMMEDIATE MEDICAL CARE IF:  You develop chest pain, an irregular heart beat (palpitations), or a rapid heart beat. MAKE SURE YOU:   Understand these instructions.  Will watch your condition.  Will get help right away if you are not doing well or get worse. Document Released: 12/31/2004 Document Revised: 03/25/2011 Document Reviewed: 08/21/2007 ExitCare Patient Information 2015 [employer]. This information is not intended to replace advice given to you by your health care provider. Make sure you discuss any questions you have with your health care provider.  

## 2013-08-18 LAB — TSH: TSH: 3.53 u[IU]/mL (ref 0.350–4.500)

## 2013-08-18 LAB — T3, FREE: T3, Free: 2.8 pg/mL (ref 2.3–4.2)

## 2013-08-18 LAB — T4, FREE: Free T4: 1.36 ng/dL (ref 0.80–1.80)

## 2013-08-31 NOTE — Progress Notes (Signed)
History and physical examinations reviewed with Dr. Berniece Salines.  Agree with assessment and plan.

## 2014-01-12 ENCOUNTER — Ambulatory Visit (INDEPENDENT_AMBULATORY_CARE_PROVIDER_SITE_OTHER): Payer: 59 | Admitting: Family Medicine

## 2014-01-12 VITALS — BP 126/80 | HR 71 | Temp 98.4°F | Resp 17 | Ht 59.5 in | Wt 152.0 lb

## 2014-01-12 DIAGNOSIS — R1013 Epigastric pain: Secondary | ICD-10-CM

## 2014-01-12 LAB — POCT CBC
Granulocyte percent: 75.8 %G (ref 37–80)
HCT, POC: 39.7 % (ref 37.7–47.9)
HEMOGLOBIN: 13.3 g/dL (ref 12.2–16.2)
LYMPH, POC: 1.4 (ref 0.6–3.4)
MCH, POC: 30.5 pg (ref 27–31.2)
MCHC: 33.4 g/dL (ref 31.8–35.4)
MCV: 91.1 fL (ref 80–97)
MID (CBC): 0.6 (ref 0–0.9)
MPV: 7.6 fL (ref 0–99.8)
PLATELET COUNT, POC: 304 10*3/uL (ref 142–424)
POC GRANULOCYTE: 6.4 (ref 2–6.9)
POC LYMPH PERCENT: 16.8 %L (ref 10–50)
POC MID %: 7.4 % (ref 0–12)
RBC: 4.35 M/uL (ref 4.04–5.48)
RDW, POC: 11.9 %
WBC: 8.4 10*3/uL (ref 4.6–10.2)

## 2014-01-12 MED ORDER — PROMETHAZINE HCL 25 MG/ML IJ SOLN
25.0000 mg | Freq: Once | INTRAMUSCULAR | Status: AC
Start: 2014-01-12 — End: 2014-01-12
  Administered 2014-01-12: 25 mg via INTRAMUSCULAR

## 2014-01-12 MED ORDER — OXYCODONE-ACETAMINOPHEN 5-325 MG PO TABS: 1.0000 | ORAL_TABLET | Freq: Three times a day (TID) | ORAL | Status: DC | PRN

## 2014-01-12 MED ORDER — ONDANSETRON 8 MG PO TBDP: 8.0000 mg | ORAL_TABLET | Freq: Three times a day (TID) | ORAL | Status: DC | PRN

## 2014-01-12 MED ORDER — KETOROLAC TROMETHAMINE 60 MG/2ML IM SOLN
60.0000 mg | Freq: Once | INTRAMUSCULAR | Status: AC
Start: 2014-01-12 — End: 2014-01-12
  Administered 2014-01-12: 60 mg via INTRAMUSCULAR

## 2014-01-12 NOTE — Patient Instructions (Signed)
Stick tonight on ice chips and sips of liquid.  Hopefully you can get some rest.  If your pain continues or worsens, go to the ER for a CT scan.  If you are not feeling signifiantly better in the morning, we will send you for a CT scan.  Viral Gastroenteritis Viral gastroenteritis is also known as stomach flu. This condition affects the stomach and intestinal tract. It can cause sudden diarrhea and vomiting. The illness typically lasts 3 to 8 days. Most people develop an immune response that eventually gets rid of the virus. While this natural response develops, the virus can make you quite ill. CAUSES  Many different viruses can cause gastroenteritis, such as rotavirus or noroviruses. You can catch one of these viruses by consuming contaminated food or water. You may also catch a virus by sharing utensils or other personal items with an infected person or by touching a contaminated surface. SYMPTOMS  The most common symptoms are diarrhea and vomiting. These problems can cause a severe loss of body fluids (dehydration) and a body salt (electrolyte) imbalance. Other symptoms may include:  Fever.  Headache.  Fatigue.  Abdominal pain. DIAGNOSIS  Your caregiver can usually diagnose viral gastroenteritis based on your symptoms and a physical exam. A stool sample may also be taken to test for the presence of viruses or other infections. TREATMENT  This illness typically goes away on its own. Treatments are aimed at rehydration. The most serious cases of viral gastroenteritis involve vomiting so severely that you are not able to keep fluids down. In these cases, fluids must be given through an intravenous line (IV). HOME CARE INSTRUCTIONS   Drink enough fluids to keep your urine clear or pale yellow. Drink small amounts of fluids frequently and increase the amounts as tolerated.  Ask your caregiver for specific rehydration instructions.  Avoid:  Foods high in sugar.  Alcohol.  Carbonated  drinks.  Tobacco.  Juice.  Caffeine drinks.  Extremely hot or cold fluids.  Fatty, greasy foods.  Too much intake of anything at one time.  Dairy products until 24 to 48 hours after diarrhea stops.  You may consume probiotics. Probiotics are active cultures of beneficial bacteria. They may lessen the amount and number of diarrheal stools in adults. Probiotics can be found in yogurt with active cultures and in supplements.  Wash your hands well to avoid spreading the virus.  Only take over-the-counter or prescription medicines for pain, discomfort, or fever as directed by your caregiver. Do not give aspirin to children. Antidiarrheal medicines are not recommended.  Ask your caregiver if you should continue to take your regular prescribed and over-the-counter medicines.  Keep all follow-up appointments as directed by your caregiver. SEEK IMMEDIATE MEDICAL CARE IF:   You are unable to keep fluids down.  You do not urinate at least once every 6 to 8 hours.  You develop shortness of breath.  You notice blood in your stool or vomit. This may look like coffee grounds.  You have abdominal pain that increases or is concentrated in one small area (localized).  You have persistent vomiting or diarrhea.  You have a fever.  The patient is a child younger than 3 months, and he or she has a fever.  The patient is a child older than 3 months, and he or she has a fever and persistent symptoms.  The patient is a child older than 3 months, and he or she has a fever and symptoms suddenly get worse.  The patient is a baby, and he or she has no tears when crying. MAKE SURE YOU:   Understand these instructions.  Will watch your condition.  Will get help right away if you are not doing well or get worse. Document Released: 12/31/2004 Document Revised: 03/25/2011 Document Reviewed: 10/17/2010 [facility] Patient Information 2015 [place], [place]. This information is not intended to replace  advice given to you by your health care provider. Make sure you discuss any questions you have with your health care provider. Cholelithiasis Cholelithiasis (also called gallstones) is a form of gallbladder disease in which gallstones form in your gallbladder. The gallbladder is an organ that stores bile made in the liver, which helps digest fats. Gallstones begin as small crystals and slowly grow into stones. Gallstone pain occurs when the gallbladder spasms and a gallstone is blocking the duct. Pain can also occur when a stone passes out of the duct.  RISK FACTORS  Being female.   Having multiple pregnancies. Health care providers sometimes advise removing diseased gallbladders before future pregnancies.   Being obese.  Eating a diet heavy in fried foods and fat.   Being older than 14 years and increasing age.   Prolonged use of medicines containing female hormones.   Having diabetes mellitus.   Rapidly losing weight.   Having a family history of gallstones (heredity).  SYMPTOMS  Nausea.   Vomiting.  Abdominal pain.   Yellowing of the skin (jaundice).   Sudden pain. It may persist from several minutes to several hours.  Fever.   Tenderness to the touch. In some cases, when gallstones do not move into the bile duct, people have no pain or symptoms. These are called "silent" gallstones.  TREATMENT Silent gallstones do not need treatment. In severe cases, emergency surgery may be required. Options for treatment include:  Surgery to remove the gallbladder. This is the most common treatment.  Medicines. These do not always work and may take 6-12 months or more to work.  Shock wave treatment (extracorporeal biliary lithotripsy). In this treatment an ultrasound machine sends shock waves to the gallbladder to break gallstones into smaller pieces that can pass into the intestines or be dissolved by medicine. HOME CARE INSTRUCTIONS   Only take over-the-counter or  prescription medicines for pain, discomfort, or fever as directed by your health care provider.   Follow a low-fat diet until seen again by your health care provider. Fat causes the gallbladder to contract, which can result in pain.   Follow up with your health care provider as directed. Attacks are almost always recurrent and surgery is usually required for permanent treatment.  SEEK IMMEDIATE MEDICAL CARE IF:   Your pain increases and is not controlled by medicines.   You have a fever or persistent symptoms for more than 2-3 days.   You have a fever and your symptoms suddenly get worse.   You have persistent nausea and vomiting.  MAKE SURE YOU:   Understand these instructions.  Will watch your condition.  Will get help right away if you are not doing well or get worse. Document Released: 12/27/2004 Document Revised: 09/02/2012 Document Reviewed: 06/24/2012 [facility] Patient Information 2015 [place], [place]. This information is not intended to replace advice given to you by your health care provider. Make sure you discuss any questions you have with your health care provider. Small Bowel Obstruction A small bowel obstruction is a blockage (obstruction) of the small intestine (small bowel). The small bowel is a long, slender tube that connects  the stomach to the colon. Its job is to absorb nutrients from the fluids and foods you consume into the bloodstream.  CAUSES  There are many causes of intestinal blockage. The most common ones include:  Hernias. This is a more common cause in children than adults.  Inflammatory bowel disease (enteritis and colitis).  Twisting of the bowel (volvulus).  Tumors.  Scar tissue (adhesions) from previous surgery or radiation treatment.  Recent surgery. This may cause an acute small bowel obstruction called an ileus. SYMPTOMS   Abdominal pain. This may be dull cramps or sharp pain. It may occur in one area or may be present in the entire  abdomen. Pain can range from mild to severe, depending on the degree of obstruction.  Nausea and vomiting. Vomit may be greenish or yellow bile color.  Distended or swollen stomach. Abdominal bloating is a common symptom.  Constipation.  Lack of passing gas.  Frequent belching.  Diarrhea. This may occur if runny stool is able to leak around the obstruction. DIAGNOSIS  Your caregiver can usually diagnose small bowel obstruction by taking a history, doing a physical exam, and taking X-rays. If the cause is unclear, a CT scan (computerized tomography) of your abdomen and pelvis may be needed. TREATMENT  Treatment of the blockage depends on the cause and how bad the problem is.   Sometimes, the obstruction improves with bed rest and intravenous (IV) fluids.  Resting the bowel is very important. This means following a simple diet. Sometimes, a clear liquid diet may be required for several days.  Sometimes, a small tube (nasogastric tube) is placed into the stomach to decompress the bowel. When the bowel is blocked, it usually swells up like a balloon filled with air and fluids. Decompression means that the air and fluids are removed by suction through that tube. This can help with pain, discomfort, and nausea. It can also help the obstruction resolve faster.  Surgery may be required if other treatments do not work. Bowel obstruction from a hernia may require early surgery and can be an emergency procedure. Adhesions that cause frequent or severe obstructions may also require surgery. HOME CARE INSTRUCTIONS If your bowel obstruction is only partial or incomplete, you may be allowed to go home.  Get plenty of rest.  Follow your diet as directed by your caregiver.  Only consume clear liquids until your condition improves.  Avoid solid foods as instructed. SEEK IMMEDIATE MEDICAL CARE IF:  You have increased pain or cramping.  You vomit blood.  You have uncontrolled vomiting or  nausea.  You cannot drink fluids due to vomiting or pain.  You develop confusion.  You begin feeling very dry or thirsty (dehydrated).  You have severe bloating.  You have chills.  You have a fever.  You feel extremely weak or you faint. MAKE SURE YOU:  Understand these instructions.  Will watch your condition.  Will get help right away if you are not doing well or get worse. Document Released: 03/19/2005 Document Revised: 03/25/2011 Document Reviewed: 03/16/2010 [facility] Patient Information 2015 [place], [place]. This information is not intended to replace advice given to you by your health care provider. Make sure you discuss any questions you have with your health care provider.

## 2014-01-13 ENCOUNTER — Telehealth: Payer: Self-pay

## 2014-01-13 LAB — COMPREHENSIVE METABOLIC PANEL
ALBUMIN: 4.6 g/dL (ref 3.5–5.2)
ALK PHOS: 68 U/L (ref 39–117)
ALT: 24 U/L (ref 0–35)
AST: 23 U/L (ref 0–37)
BUN: 16 mg/dL (ref 6–23)
CALCIUM: 10.2 mg/dL (ref 8.4–10.5)
CHLORIDE: 101 meq/L (ref 96–112)
CO2: 27 mEq/L (ref 19–32)
Creat: 0.91 mg/dL (ref 0.50–1.10)
Glucose, Bld: 133 mg/dL — ABNORMAL HIGH (ref 70–99)
POTASSIUM: 4.1 meq/L (ref 3.5–5.3)
SODIUM: 141 meq/L (ref 135–145)
Total Bilirubin: 0.5 mg/dL (ref 0.2–1.2)
Total Protein: 8 g/dL (ref 6.0–8.3)

## 2014-01-13 LAB — LIPASE: LIPASE: 16 U/L (ref 0–75)

## 2014-01-13 NOTE — Telephone Encounter (Signed)
Pt of Dr. Brigitte Pulse retunring phone call to et her know that she is feeling better, and doing well.

## 2014-01-23 ENCOUNTER — Encounter: Payer: Self-pay | Admitting: Family Medicine

## 2014-01-23 NOTE — Progress Notes (Signed)
Subjective:  This chart was scribe for Delman Cheadle, MD by Judithann Sauger, ED Scribe. The patient was seen in Room 8 and the patient's care was started at 9:02 PM.    Patient ID: Deborah Ramirez, female    DOB: [birth year], 49 y.o.   MRN: [id number] Chief Complaint  Patient presents with  . Diarrhea  . Abdominal Pain    Diarrhea  Associated symptoms include abdominal pain and vomiting. Pertinent negatives include no chills, coughing, fever or headaches.  Abdominal Pain Associated symptoms include diarrhea, nausea and vomiting. Pertinent negatives include no dysuria, fever, frequency or headaches.   HPI Comments: Deborah Ramirez is a 49 y.o. female who presents to the Emergency Department complaining of a constant gradually worsening abdominal pain from the epigastric to the suprapubic and vomiting onset 3 hours ago. She explains that her symptoms started immediately after eating dinner tonight. She reports associated nausea and vomiting. She denies diarrhea, back pain, dysuria, frequency, and problems with her BM or blood in stool. She reports surgery on her abdomen in the past where she was supposed to have her appendix and gall bladder out however she was recently told that she still has her gall bladder. She states that she had imaging done at [facility] in the past.   Past Medical History  Diagnosis Date  . Fibromyalgia   . Hyperlipidemia   . Endometriosis 1990  . Hypothyroidism   . Vitamin D deficiency    Current Outpatient Prescriptions on File Prior to Visit  Medication Sig Dispense Refill  . levothyroxine (SYNTHROID, LEVOTHROID) 75 MCG tablet Take 1 tablet (75 mcg total) by mouth daily. PATIENT NEEDS OFFICE VISIT/LABS FOR ADDITIONAL REFILLS 90 tablet 3   No current facility-administered medications on file prior to visit.   No Known Allergies    Review of Systems  Constitutional: Negative for fever, chills and appetite change.  HENT: Negative for congestion  and sore throat.   Eyes: Negative for pain.  Respiratory: Negative for cough and shortness of breath.   Gastrointestinal: Positive for nausea, vomiting, abdominal pain and diarrhea. Negative for blood in stool.  Genitourinary: Negative for dysuria and frequency.  Musculoskeletal: Negative for back pain.  Skin: Positive for rash.  Neurological: Negative for light-headedness and headaches.  Psychiatric/Behavioral: Negative for confusion.       Objective:   Physical Exam  Constitutional: She is oriented to person, place, and time. She appears well-developed and well-nourished. No distress.  HENT:  Head: Normocephalic and atraumatic.  Eyes: Conjunctivae and EOM are normal.  Neck: Neck supple. No tracheal deviation present.  Cardiovascular: Normal rate, regular rhythm and normal heart sounds.   Pulmonary/Chest: Effort normal and breath sounds normal. No respiratory distress.  No CVA tenderness   Abdominal: There is tenderness. There is no rebound and no guarding.  Generalized tenderness, no referred pain   Musculoskeletal: Normal range of motion.  Neurological: She is alert and oriented to person, place, and time.  Skin: Skin is warm and dry.  Psychiatric: She has a normal mood and affect. Her behavior is normal.  Nursing note and vitals reviewed.    BP 126/80 mmHg  Pulse 71  Temp(Src) 98.4 F (36.9 C) (Oral)  Resp 17  Ht 4' 11.5" (1.511 m)  Wt 152 lb (68.947 kg)  BMI 30.20 kg/m2  SpO2 97%      Assessment & Plan:  9:13 PM- Pt advised of plan for treatment and pt agrees.  Abdominal pain, epigastric - Plan: POCT  CBC, Comprehensive metabolic panel, Lipase, ketorolac (TORADOL) injection 60 mg, promethazine (PHENERGAN) injection 25 mg Pt given IM toradol and phenergan x 1 in office. Pt is having a lot of pain and definitely seems in distress but no peritoneal signs - discussed options but as it is 9 p.m. Right now she will go home and try to sleep due to promethazine - limit po  intake to sips and ice chips. If sxs worsen o/n -> call 911 and go to ER. Recheck tomorrow morning w/ me - in 12 hrous - If sxs have not significantly improved at that time will sent pt for CT - ddx includes gastroenteritis (most likely) but poss for small bowel obstruction, diverticulosis, pancreatitis, cholecysitis.  Pt was driven here by her SO who will keep an eye on her o/n and call 911 if pain is worsening or any fevers.  Meds ordered this encounter  Medications  . ketorolac (TORADOL) injection 60 mg    Sig:   . promethazine (PHENERGAN) injection 25 mg    Sig:   . oxyCODONE-acetaminophen (ROXICET) 5-325 MG per tablet    Sig: Take 1 tablet by mouth every 8 (eight) hours as needed for severe pain.    Dispense:  20 tablet    Refill:  0  . ondansetron (ZOFRAN-ODT) 8 MG disintegrating tablet    Sig: Take 1 tablet (8 mg total) by mouth every 8 (eight) hours as needed for nausea.    Dispense:  30 tablet    Refill:  0    I personally performed the services described in this documentation, which was scribed in my presence. The recorded information has been reviewed and considered, and addended by me as needed.  Delman Cheadle, MD MPH

## 2014-02-17 ENCOUNTER — Other Ambulatory Visit: Payer: Self-pay

## 2014-02-17 DIAGNOSIS — Z1231 Encounter for screening mammogram for malignant neoplasm of breast: Secondary | ICD-10-CM

## 2014-03-01 ENCOUNTER — Encounter (INDEPENDENT_AMBULATORY_CARE_PROVIDER_SITE_OTHER): Payer: Self-pay

## 2014-03-01 ENCOUNTER — Ambulatory Visit
Admission: RE | Admit: 2014-03-01 | Discharge: 2014-03-01 | Disposition: A | Discharge status: Home / Self Care | Payer: 59 | Source: Ambulatory Visit

## 2014-03-01 DIAGNOSIS — Z1231 Encounter for screening mammogram for malignant neoplasm of breast: Secondary | ICD-10-CM

## 2014-08-01 ENCOUNTER — Encounter: Payer: Self-pay | Admitting: Genetic Counselor

## 2014-08-20 ENCOUNTER — Ambulatory Visit (INDEPENDENT_AMBULATORY_CARE_PROVIDER_SITE_OTHER): Payer: 59 | Admitting: Family Medicine

## 2014-08-20 ENCOUNTER — Encounter: Payer: Self-pay | Admitting: Family Medicine

## 2014-08-20 VITALS — BP 110/68 | HR 70 | Temp 98.1°F | Resp 16 | Ht 58.5 in | Wt 147.6 lb

## 2014-08-20 DIAGNOSIS — M549 Dorsalgia, unspecified: Secondary | ICD-10-CM

## 2014-08-20 DIAGNOSIS — N62 Hypertrophy of breast: Secondary | ICD-10-CM

## 2014-08-20 DIAGNOSIS — G8929 Other chronic pain: Secondary | ICD-10-CM

## 2014-08-20 DIAGNOSIS — E039 Hypothyroidism, unspecified: Secondary | ICD-10-CM

## 2014-08-20 DIAGNOSIS — M542 Cervicalgia: Secondary | ICD-10-CM

## 2014-08-20 LAB — TSH: TSH: 2.892 u[IU]/mL (ref 0.350–4.500)

## 2014-08-20 MED ORDER — LEVOTHYROXINE SODIUM 75 MCG PO TABS: 75.0000 ug | ORAL_TABLET | Freq: Every day | ORAL | Status: DC

## 2014-08-20 NOTE — Patient Instructions (Signed)
I will send in a year refill after your tsh returns.  I sent in a 1 month supply today so that you don't skip to many days. Breast Reduction  Breast reduction (also called reduction mammoplasty) is surgery to reduce the size of your breasts. To do this, a surgeon removes fat, tissue, and excess skin. There are some common reasons for reduction mammoplasty. In some people, large breasts contribute to poor posture or chronic neck and back pain. Sometimes a rash develops under the breasts. Large breasts can also make it difficult to exercise. Before the procedure, you and your surgeon will decide on a new size and shape for your breasts. The surgery usually takes 2-4 hours.  After breast reduction, your breasts will be smaller. This should make movement easier.  LET [facility] CARE PROVIDER KNOW ABOUT:  Any allergies you have.  All medicines you are taking, including vitamins, herbs, eye drops, creams, and over-the-counter medicines.  Previous problems you or members of your family have had with the use of anesthetics.  Any blood disorders you have.  Previous surgeries you have had.  Medical conditions you have.  Any recent colds or fevers you have had. RISKS AND COMPLICATIONS Generally, this is a safe procedure. However, as with any procedure, complications can occur. Possible complications include:  Excessive bleeding.  Blood clots in your legs or lungs.  Pool of blood forming near the wound (hematoma). This is caused by a broken blood vessel.  Infection.  Total or partial loss of feeling in your breasts or nipples. Discuss this with your surgeon before the surgery.  Damage to the dark area around your nipple (areola).  Loss of ability to breastfeed.  Pneumonia. This is a risk with many surgeries. BEFORE THE PROCEDURE  Ask your health care provider about changing or stopping any regular medicines. Make sure you know when to stop taking nonsteroidal anti-inflammatory drugs  (NSAIDs), vitamin E, and herbal supplements. These can increase your risk of bleeding during surgery.  Do not eat or drink anything for at least 8 hours before your surgery. Ask your health care provider about taking a sip of water with any approved medicines.  Quit smoking. Smoking makes healing more difficult. It also makes scarring more likely.  If you are older than 35 years, your surgeon might want you to have mammography before surgery.  You might be asked to shower with an antibacterial soap before coming in for the procedure.  Arrange for someone to drive you home. PROCEDURE  An IV access tube will be placed in one of your veins. You will be given medicine to make you sleep (general anesthetic).  Cuts (incisions) will be made on your breasts.  The surgeon will first remove fat, tissue, and excess skin. Then the surgeon will reshape your breasts. Your nipples might be removed, and then replaced, when the reshaping begins.  The incisions will be closed with many tiny stitches.  The surgeon may place small tubes under your skin to allow the wound to drain for a few days. You will be taught how to care for the drains.  At the end of the surgery, your breasts will be bandaged securely with gauze and elastic bandages. This will protect your breasts for the first few days and promote healing. AFTER THE PROCEDURE  You will be taken to a recovery area. There, your recovery will be monitored. Your heartbeat, breathing rate, temperature, and blood pressure (vital signs) will be checked.  If you had outpatient  surgery, you will be able to go home when you are stable and able to drink fluids. If you had inpatient surgery, you will be taken to your room.  You might be given a special bra to wear while your breasts heal. Document Released: 03/29/2008 Document Revised: 01/05/2013 Document Reviewed: 07/03/2012 [facility] Patient Information 2015 [place], [place]. This information is not intended  to replace advice given to you by your health care provider. Make sure you discuss any questions you have with your health care provider.

## 2014-08-20 NOTE — Progress Notes (Addendum)
Subjective:  This chart was scribed for Delman Cheadle, MD by Leandra Kern, Medical Scribe. This patient was seen in Room 10 and the patient's care was started at 1:30 PM.   Patient ID: Deborah Ramirez, female    DOB: Feb 06, 1965, 49 y.o.   MRN: [id number]  Chief Complaint  Patient presents with  . Medication Refill    Synthroid   HPI HPI Comments: Deborah Ramirez is a 49 y.o. female who presents to [facility] for a medication refill of Synthroid.  Pt notes that she ran out of the medication yesterday.   Pt notes that she occasionally experiences trapezius pain on her upper back that radiates to her neck area. She notes that the pain usually resolves by itself after a while. 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 Pt indicates that the vest that she needs to wear for work makes it very uncomfortable. Pt is planning to have a breast reduction surgery done, and she was advised that Dr. Stephanie Coup is a good physician to refer to.   Pt denies any symptoms of hyper or hypo thyroidism such as skin or hair changes, bowel or urinary problems. Pt expresses willing to getting blood work done on her.   Pt is a firewoman.  Patient Active Problem List   Diagnosis Date Noted  . Hypothyroidism 08/25/2011  . Leukopenia 08/25/2011  . Hyperlipidemia 08/25/2011   Past Medical History  Diagnosis Date  . Fibromyalgia   . Hyperlipidemia   . Endometriosis 1990  . Hypothyroidism   . Vitamin D deficiency    Past Surgical History  Procedure Laterality Date  . Oophorectomy    . Cholecystectomy     No Known Allergies Prior to Admission medications   Medication Sig Start Date End Date Taking? Authorizing Provider  levothyroxine (SYNTHROID, LEVOTHROID) 75 MCG tablet Take 1 tablet (75 mcg total) by mouth daily. PATIENT NEEDS OFFICE VISIT/LABS FOR ADDITIONAL REFILLS 08/17/13  Yes John C Pick-Jacobs, DO   History   Social History    . Marital Status: Single    Spouse Name: N/A  . Number of Children: N/A  . Years of Education: N/A   Occupational History  .  [employer]    Engineer, structural   Social History Main Topics  . Smoking status: Never Smoker   . Smokeless tobacco: Not on file  . Alcohol Use: Not on file  . Drug Use: Not on file  . Sexual Activity: Not on file     Comment: SSP   Other Topics Concern  . Not on file   Social History Narrative   Depression screen [facility] 2/9 08/20/2014  Decreased Interest 0  Down, Depressed, Hopeless 0  PHQ - 2 Score 0     Review of Systems  Constitutional: Negative for fever, chills, diaphoresis, activity change, appetite change, fatigue and unexpected weight change.  Eyes: Negative for visual disturbance.  Cardiovascular: Negative for palpitations.  Gastrointestinal: Negative for nausea, vomiting, abdominal pain, diarrhea and constipation.  Endocrine: Negative for cold intolerance and heat intolerance.  Genitourinary: Negative for dysuria, urgency and frequency.  Musculoskeletal: Positive for myalgias.  Skin: Negative for color change and rash.  Neurological: Negative for dizziness, tremors, weakness, light-headedness and numbness.  Psychiatric/Behavioral: Negative for dysphoric mood. The patient is not nervous/anxious.       Objective:   Physical Exam  Constitutional: She is oriented to person, place, and time. She appears  well-developed and well-nourished. No distress.  HENT:  Head: Normocephalic and atraumatic.  Eyes: EOM are normal. Pupils are equal, round, and reactive to light.  Neck: Neck supple.  Cardiovascular: Normal rate, regular rhythm and normal heart sounds.  Exam reveals no gallop and no friction rub.   No murmur heard. Pulmonary/Chest: Effort normal and breath sounds normal. No respiratory distress. She has no wheezes. She has no rales.  Neurological: She is alert and oriented to person, place, and time. No cranial nerve deficit.  Skin: Skin  is warm and dry.  Psychiatric: She has a normal mood and affect. Her behavior is normal.  Nursing note and vitals reviewed.  BP 110/68 mmHg  Pulse 70  Temp(Src) 98.1 F (36.7 C) (Oral)  Resp 16  Ht 4' 10.5" (1.486 m)  Wt 147 lb 9.6 oz (66.951 kg)  BMI 30.32 kg/m2  SpO2 100%      Assessment & Plan:   1. Hypothyroidism, unspecified hypothyroidism type - tsh stable, refilled.  Has work labs done every Jan which she will have faxed here so that we can refill her levothyroxine w/o a visit going forward as long as tsh remains w/in range.  2. Chronic neck and back pain   3. Breast prominence - cuasing neck pain and breathing restrictions due to required bullet resistance vests at work    Orders Placed This Encounter  Procedures  . TSH  . Ambulatory referral to Plastic Surgery    Referral Priority:  Routine    Referral Type:  Surgical    Referral Reason:  Specialty Services Required    Requested Specialty:  Plastic Surgery    Number of Visits Requested:  1    Meds ordered this encounter  Medications  . DISCONTD: levothyroxine (SYNTHROID, LEVOTHROID) 75 MCG tablet    Sig: Take 1 tablet (75 mcg total) by mouth daily.    Dispense:  30 tablet    Refill:  0  . levothyroxine (SYNTHROID, LEVOTHROID) 75 MCG tablet    Sig: Take 1 tablet (75 mcg total) by mouth daily before breakfast.    Dispense:  90 tablet    Refill:  3    I personally performed the services described in this documentation, which was scribed in my presence. The recorded information has been reviewed and considered, and addended by me as needed.  Delman Cheadle, MD MPH

## 2014-09-19 ENCOUNTER — Telehealth: Payer: Self-pay

## 2014-09-19 NOTE — Telephone Encounter (Signed)
Pt states she is out of her levothyroxine (SYNTHROID, LEVOTHROID) 75 MCG tablet. She only gets 30 tablets each month and wanted to see if meds can get renewed for 6 moths.  Please advise [phone]

## 2014-09-19 NOTE — Telephone Encounter (Signed)
levothyroxine (SYNTHROID, LEVOTHROID) 75 MCG tablet [[id number]]     Order Details    Dose: 75 mcg Route: Oral Frequency: Daily before breakfast   Indications of Use: Hypothyroidism   Dispense Quantity:  90 tablet Refills:  3 Fills Remaining:  3          Sig: Take 1 tablet (75 mcg total) by mouth daily before breakfast.              Left message on 8/6 her Rx was sent in for 90 days with 3 refills. Advised to call her pharmacy.

## 2015-08-26 ENCOUNTER — Other Ambulatory Visit: Payer: Self-pay | Admitting: Family Medicine

## 2015-08-26 DIAGNOSIS — E039 Hypothyroidism, unspecified: Secondary | ICD-10-CM

## 2015-09-28 ENCOUNTER — Ambulatory Visit (INDEPENDENT_AMBULATORY_CARE_PROVIDER_SITE_OTHER): Payer: 59 | Admitting: Family Medicine

## 2015-09-28 DIAGNOSIS — E039 Hypothyroidism, unspecified: Secondary | ICD-10-CM | POA: Diagnosis not present

## 2015-09-28 MED ORDER — LEVOTHYROXINE SODIUM 75 MCG PO TABS: ORAL_TABLET | ORAL | 3 refills | Status: DC

## 2015-09-28 MED ORDER — LEVOTHYROXINE SODIUM 75 MCG PO TABS: ORAL_TABLET | ORAL | 1 refills | Status: DC

## 2015-09-28 NOTE — Patient Instructions (Addendum)
No change in meds for now. I refilled your medications for 1 year. If any change in symptoms, or thyroid test is abnormal at your work physical, let us know as we can possibly change medications at that time.   See information below on what we would usually recommend at the time of a physical, and if there are components that are not discussed at your work physical, let us know.   IF you received an x-ray today, you will receive an invoice from [facility]. Please contact [facility] at [phone] with questions or concerns regarding your invoice.   IF you received labwork today, you will receive an invoice from [facility]. Please contact [facility] at [phone] with questions or concerns regarding your invoice.   Our billing staff will not be able to assist you with questions regarding bills from these companies.  You will be contacted with the lab results as soon as they are available. The fastest way to get your results is to activate your My Chart account. Instructions are located on the last page of this paperwork. If you have not heard from [place] regarding the results in 2 weeks, please contact this office.    Keeping You Healthy  Get These Tests  Blood Pressure- Have your blood pressure checked by your healthcare provider at least once a year.  Normal blood pressure is 120/80.  Weight- Have your body mass index (BMI) calculated to screen for obesity.  BMI is a measure of body fat based on height and weight.  You can calculate your own BMI at [employer]velBags.it  Cholesterol- Have your cholesterol checked every year.  Diabetes- Have your blood sugar checked every year if you have high blood pressure, high cholesterol, a family history of diabetes or if you are overweight.  Pap Test - Have a pap test every 1 to 5 years if you have been sexually active.  If you are older than 65 and recent pap tests have been normal you may not need  additional pap tests.  In addition, if you have had a hysterectomy  for benign disease additional pap tests are not necessary.  Mammogram-Yearly mammograms are essential for early detection of breast cancer  Screening for Colon Cancer- Colonoscopy starting at age 3. Screening may begin sooner depending on your family history and other health conditions.  Follow up colonoscopy as directed by your Gastroenterologist.  Screening for Osteoporosis- Screening begins at age 77 with bone density scanning, sooner if you are at higher risk for developing Osteoporosis.  Get these medicines  Calcium with Vitamin D- Your body requires 1200-1500 mg of Calcium a day and [phone] IU of Vitamin D a day.  You can only absorb 500 mg of Calcium at a time therefore Calcium must be taken in 2 or 3 separate doses throughout the day.  Hormones- Hormone therapy has been associated with increased risk for certain cancers and heart disease.  Talk to your healthcare provider about if you need relief from menopausal symptoms.  Aspirin- Ask your healthcare provider about taking Aspirin to prevent Heart Disease and Stroke.  Get these Immuniztions  Flu shot- Every fall  Pneumonia shot- Once after the age of 68; if you are younger ask your healthcare provider if you need a pneumonia shot.  Tetanus- Every ten years.  Zostavax- Once after the age of 73 to prevent shingles.  Take these steps  Don't smoke- Your healthcare provider can help you quit. For tips on how to quit, ask  your healthcare provider or go to www.smokefree.gov or call 1-800 QUIT-NOW.  Be physically active- Exercise 5 days a week for a minimum of 30 minutes.  If you are not already physically active, start slow and gradually work up to 30 minutes of moderate physical activity.  Try walking, dancing, bike riding, swimming, etc.  Eat a healthy diet- Eat a variety of healthy foods such as fruits, vegetables, whole grains, low fat milk, low fat cheeses,  yogurt, lean meats, chicken, fish, eggs, dried beans, tofu, etc.  For more information go to www.thenutritionsource.org  Dental visit- Brush and floss teeth twice daily; visit your dentist twice a year.  Eye exam- Visit your Optometrist or Ophthalmologist yearly.  Drink alcohol in moderation- Limit alcohol intake to one drink or less a day.  Never drink and drive.  Depression- Your emotional health is as important as your physical health.  If you're feeling down or losing interest in things you normally enjoy, please talk to your healthcare provider.  Seat Belts- can save your life; always wear one  Smoke/Carbon Monoxide detectors- These detectors need to be installed on the appropriate level of your home.  Replace batteries at least once a year.  Violence- If anyone is threatening or hurting you, please tell your healthcare provider.  Living Will/ Health care power of attorney- Discuss with your healthcare provider and family.

## 2015-09-28 NOTE — Progress Notes (Signed)
Subjective:  By signing my name below, I, Moises Blood, attest that this documentation has been prepared under the direction and in the presence of Merri Ray, MD. Electronically Signed: Moises Blood, [place]. 09/28/2015 , 6:32 PM .  Patient was seen in Room 6 .   Patient ID: Deborah Ramirez, female    DOB: 1966/01/04, 50 y.o.   MRN: [id number] Chief Complaint  Patient presents with  . blood work  . Medication Refill    synthroid   HPI Deborah Ramirez is a 50 y.o. female  Patient is here for blood work to refill her synthroid. She has h/o hypothyroidism. Her PCP is SHAW,EVA, MD; last visit with her was in Aug 2016. She takes synthroid 38mcg qd. Her TSH has been stable over the past 2 years.   Lab Results  Component Value Date   TSH 2.892 08/20/2014   She denies any hair changes, skin changes, chest pain, palpitations, unexpected weight change, cold tolerance or heat tolerance. She denies any history being informed of nodules over her thyroid. She's taken armour thyroid in the past without side effects.   She usually has extensive annual physical through the police department in December. She had colonoscopy done a few years ago and is due for another one next year. She has mammogram scheduled for next month. Her last pap smear was in 2013 according to [facility], but she informs having one done after that.   She works as an Garment/textile technologist for [employer].   Patient Active Problem List   Diagnosis Date Noted  . Hypothyroidism 08/25/2011  . Leukopenia 08/25/2011  . Hyperlipidemia 08/25/2011   Past Medical History:  Diagnosis Date  . Endometriosis 1990  . Fibromyalgia   . Hyperlipidemia   . Hypothyroidism   . Vitamin D deficiency    Past Surgical History:  Procedure Laterality Date  . CHOLECYSTECTOMY    . OOPHORECTOMY     No Known Allergies Prior to Admission medications   Medication Sig Start Date End Date Taking? Authorizing Provider  levothyroxine (SYNTHROID,  LEVOTHROID) 75 MCG tablet take 1 tablet by mouth every morning before BREAKFAST 08/28/15  Yes Shawnee Knapp, MD   Social History   Social History  . Marital status: Single    Spouse name: N/A  . Number of children: N/A  . Years of education: N/A   Occupational History  .  [employer]    Engineer, structural   Social History Main Topics  . Smoking status: Never Smoker  . Smokeless tobacco: Never Used  . Alcohol use Not on file  . Drug use: Unknown  . Sexual activity: Not on file     Comment: SSP   Other Topics Concern  . Not on file   Social History Narrative  . No narrative on file   Review of Systems  Constitutional: Negative for activity change, chills, fatigue and fever.  Respiratory: Negative for cough, shortness of breath and wheezing.   Cardiovascular: Negative for chest pain and palpitations.  Endocrine: Negative for cold intolerance and heat intolerance.       Objective:   Physical Exam  Constitutional: She is oriented to person, place, and time. She appears well-developed and well-nourished. No distress.  HENT:  Head: Normocephalic and atraumatic.  Eyes: EOM are normal. Pupils are equal, round, and reactive to light.  Neck: Neck supple.  Cardiovascular: Normal rate.   Pulmonary/Chest: Effort normal. No respiratory distress.  Musculoskeletal: Normal range of motion.  Neurological: She is alert  and oriented to person, place, and time.  Skin: Skin is warm and dry.  Psychiatric: She has a normal mood and affect. Her behavior is normal.  Nursing note and vitals reviewed.   Vitals:   09/28/15 1753  BP: 116/74  Pulse: 68  Resp: 18  Temp: 98.6 F (37 C)  TempSrc: Oral  SpO2: 98%  Weight: 137 lb (62.1 kg)  Height: 4' 10.5" (1.486 m)      Assessment & Plan:   Deborah Ramirez is a 50 y.o. female Hypothyroidism, unspecified hypothyroidism type - Plan: TSH, levothyroxine (SYNTHROID, LEVOTHROID) 75 MCG tablet, DISCONTINUED: levothyroxine (SYNTHROID,  LEVOTHROID) 75 MCG tablet  Prior controlled. TSH ordered, cont same dose meds. Refills provided for 1 year.   Discussed CPE, but has physical through fire dept. Health maintenance items were discussed and handout on typical items discussed at CPE to make sure these are being addressed at outside office physical. RTC precautions.   Meds ordered this encounter  Medications  . DISCONTD: levothyroxine (SYNTHROID, LEVOTHROID) 75 MCG tablet    Sig: take 1 tablet by mouth every morning before BREAKFAST    Dispense:  90 tablet    Refill:  1  . levothyroxine (SYNTHROID, LEVOTHROID) 75 MCG tablet    Sig: take 1 tablet by mouth every morning before BREAKFAST    Dispense:  90 tablet    Refill:  3   Patient Instructions   No change in meds for now. I refilled your medications for 1 year. If any change in symptoms, or thyroid test is abnormal at your work physical, let us know as we can possibly change medications at that time.   See information below on what we would usually recommend at the time of a physical, and if there are components that are not discussed at your work physical, let us know.   IF you received an x-ray today, you will receive an invoice from [facility]. Please contact [facility] at [phone] with questions or concerns regarding your invoice.   IF you received labwork today, you will receive an invoice from [facility]. Please contact [facility] at [phone] with questions or concerns regarding your invoice.   Our billing staff will not be able to assist you with questions regarding bills from these companies.  You will be contacted with the lab results as soon as they are available. The fastest way to get your results is to activate your My Chart account. Instructions are located on the last page of this paperwork. If you have not heard from [place] regarding the results in 2 weeks, please contact this office.    Keeping You  Healthy  Get These Tests  Blood Pressure- Have your blood pressure checked by your healthcare provider at least once a year.  Normal blood pressure is 120/80.  Weight- Have your body mass index (BMI) calculated to screen for obesity.  BMI is a measure of body fat based on height and weight.  You can calculate your own BMI at [employer].it  Cholesterol- Have your cholesterol checked every year.  Diabetes- Have your blood sugar checked every year if you have high blood pressure, high cholesterol, a family history of diabetes or if you are overweight.  Pap Test - Have a pap test every 1 to 5 years if you have been sexually active.  If you are older than 65 and recent pap tests have been normal you may not need additional pap tests.  In addition, if you have  had a hysterectomy  for benign disease additional pap tests are not necessary.  Mammogram-Yearly mammograms are essential for early detection of breast cancer  Screening for Colon Cancer- Colonoscopy starting at age 14. Screening may begin sooner depending on your family history and other health conditions.  Follow up colonoscopy as directed by your Gastroenterologist.  Screening for Osteoporosis- Screening begins at age 64 with bone density scanning, sooner if you are at higher risk for developing Osteoporosis.  Get these medicines  Calcium with Vitamin D- Your body requires 1200-1500 mg of Calcium a day and [phone] IU of Vitamin D a day.  You can only absorb 500 mg of Calcium at a time therefore Calcium must be taken in 2 or 3 separate doses throughout the day.  Hormones- Hormone therapy has been associated with increased risk for certain cancers and heart disease.  Talk to your healthcare provider about if you need relief from menopausal symptoms.  Aspirin- Ask your healthcare provider about taking Aspirin to prevent Heart Disease and Stroke.  Get these Immuniztions  Flu shot- Every fall  Pneumonia shot- Once after the  age of 64; if you are younger ask your healthcare provider if you need a pneumonia shot.  Tetanus- Every ten years.  Zostavax- Once after the age of 7 to prevent shingles.  Take these steps  Don't smoke- Your healthcare provider can help you quit. For tips on how to quit, ask your healthcare provider or go to www.smokefree.gov or call 1-800 QUIT-NOW.  Be physically active- Exercise 5 days a week for a minimum of 30 minutes.  If you are not already physically active, start slow and gradually work up to 30 minutes of moderate physical activity.  Try walking, dancing, bike riding, swimming, etc.  Eat a healthy diet- Eat a variety of healthy foods such as fruits, vegetables, whole grains, low fat milk, low fat cheeses, yogurt, lean meats, chicken, fish, eggs, dried beans, tofu, etc.  For more information go to www.thenutritionsource.org  Dental visit- Brush and floss teeth twice daily; visit your dentist twice a year.  Eye exam- Visit your Optometrist or Ophthalmologist yearly.  Drink alcohol in moderation- Limit alcohol intake to one drink or less a day.  Never drink and drive.  Depression- Your emotional health is as important as your physical health.  If you're feeling down or losing interest in things you normally enjoy, please talk to your healthcare provider.  Seat Belts- can save your life; always wear one  Smoke/Carbon Monoxide detectors- These detectors need to be installed on the appropriate level of your home.  Replace batteries at least once a year.  Violence- If anyone is threatening or hurting you, please tell your healthcare provider.  Living Will/ Health care power of attorney- Discuss with your healthcare provider and family.    I personally performed the services described in this documentation, which was scribed in my presence. The recorded information has been reviewed and considered, and addended by me as needed.   Signed,   Merri Ray, MD [facility].  09/30/15 3:41 PM

## 2015-09-29 LAB — TSH: TSH: 5.7 mIU/L — ABNORMAL HIGH

## 2015-10-26 ENCOUNTER — Other Ambulatory Visit: Payer: Self-pay | Admitting: Family Medicine

## 2015-10-26 DIAGNOSIS — Z1231 Encounter for screening mammogram for malignant neoplasm of breast: Secondary | ICD-10-CM

## 2015-11-15 ENCOUNTER — Ambulatory Visit
Admission: RE | Admit: 2015-11-15 | Discharge: 2015-11-15 | Disposition: A | Discharge status: Home / Self Care | Payer: 59 | Source: Ambulatory Visit | Attending: Family Medicine | Admitting: Family Medicine

## 2015-11-15 DIAGNOSIS — Z1231 Encounter for screening mammogram for malignant neoplasm of breast: Secondary | ICD-10-CM

## 2016-02-10 ENCOUNTER — Ambulatory Visit (INDEPENDENT_AMBULATORY_CARE_PROVIDER_SITE_OTHER): Payer: 59 | Admitting: Family Medicine

## 2016-02-10 VITALS — BP 126/78 | HR 78 | Temp 98.7°F | Resp 18 | Ht 58.5 in | Wt 151.0 lb

## 2016-02-10 DIAGNOSIS — Z01419 Encounter for gynecological examination (general) (routine) without abnormal findings: Secondary | ICD-10-CM

## 2016-02-10 DIAGNOSIS — Z23 Encounter for immunization: Secondary | ICD-10-CM

## 2016-02-10 DIAGNOSIS — Z131 Encounter for screening for diabetes mellitus: Secondary | ICD-10-CM | POA: Diagnosis not present

## 2016-02-10 DIAGNOSIS — E7800 Pure hypercholesterolemia, unspecified: Secondary | ICD-10-CM | POA: Diagnosis not present

## 2016-02-10 DIAGNOSIS — Z0000 Encounter for general adult medical examination without abnormal findings: Secondary | ICD-10-CM

## 2016-02-10 DIAGNOSIS — E034 Atrophy of thyroid (acquired): Secondary | ICD-10-CM | POA: Diagnosis not present

## 2016-02-10 DIAGNOSIS — D708 Other neutropenia: Secondary | ICD-10-CM

## 2016-02-10 LAB — POCT URINALYSIS DIP (MANUAL ENTRY)
Bilirubin, UA: NEGATIVE
Glucose, UA: NEGATIVE
Ketones, POC UA: NEGATIVE
Leukocytes, UA: NEGATIVE
NITRITE UA: NEGATIVE
PH UA: 5
PROTEIN UA: NEGATIVE
RBC UA: NEGATIVE
Spec Grav, UA: >=1.03
UROBILINOGEN UA: 0.2

## 2016-02-10 MED ORDER — LEVOTHYROXINE SODIUM 75 MCG PO TABS: ORAL_TABLET | ORAL | 3 refills | Status: DC

## 2016-02-10 NOTE — Patient Instructions (Addendum)
   IF you received an x-ray today, you will receive an invoice from [facility]. Please contact [facility] at [phone] with questions or concerns regarding your invoice.   IF you received labwork today, you will receive an invoice from [facility]. Please contact [facility] at [phone] with questions or concerns regarding your invoice.   Our billing staff will not be able to assist you with questions regarding bills from these companies.  You will be contacted with the lab results as soon as they are available. The fastest way to get your results is to activate your My Chart account. Instructions are located on the last page of this paperwork. If you have not heard from us regarding the results in 2 weeks, please contact this office.     Keeping You Healthy  Get These Tests  Blood Pressure- Have your blood pressure checked by your healthcare provider at least once a year.  Normal blood pressure is 120/80.  Weight- Have your body mass index (BMI) calculated to screen for obesity.  BMI is a measure of body fat based on height and weight.  You can calculate your own BMI at www.nhlbisupport.com/bmi/  Cholesterol- Have your cholesterol checked every year.  Diabetes- Have your blood sugar checked every year if you have high blood pressure, high cholesterol, a family history of diabetes or if you are overweight.  Pap Test - Have a pap test every 1 to 5 years if you have been sexually active.  If you are older than 65 and recent pap tests have been normal you may not need additional pap tests.  In addition, if you have had a hysterectomy  for benign disease additional pap tests are not necessary.  Mammogram-Yearly mammograms are essential for early detection of breast cancer  Screening for Colon Cancer- Colonoscopy starting at age 50. Screening may begin sooner depending on your family history and other health conditions.  Follow up colonoscopy as directed by your  Gastroenterologist.  Screening for Osteoporosis- Screening begins at age 65 with bone density scanning, sooner if you are at higher risk for developing Osteoporosis.  Get these medicines  Calcium with Vitamin D- Your body requires 1200-1500 mg of Calcium a day and 800-1000 IU of Vitamin D a day.  You can only absorb 500 mg of Calcium at a time therefore Calcium must be taken in 2 or 3 separate doses throughout the day.  Hormones- Hormone therapy has been associated with increased risk for certain cancers and heart disease.  Talk to your healthcare provider about if you need relief from menopausal symptoms.  Aspirin- Ask your healthcare provider about taking Aspirin to prevent Heart Disease and Stroke.  Get these Immuniztions  Flu shot- Every fall  Pneumonia shot- Once after the age of 65; if you are younger ask your healthcare provider if you need a pneumonia shot.  Tetanus- Every ten years.  Zostavax- Once after the age of 60 to prevent shingles.  Take these steps  Don't smoke- Your healthcare provider can help you quit. For tips on how to quit, ask your healthcare provider or go to www.smokefree.gov or call 1-800 QUIT-NOW.  Be physically active- Exercise 5 days a week for a minimum of 30 minutes.  If you are not already physically active, start slow and gradually work up to 30 minutes of moderate physical activity.  Try walking, dancing, bike riding, swimming, etc.  Eat a healthy diet- Eat a variety of healthy foods such as fruits, vegetables, whole grains, low   fat milk, low fat cheeses, yogurt, lean meats, chicken, fish, eggs, dried beans, tofu, etc.  For more information go to www.thenutritionsource.org  Dental visit- Brush and floss teeth twice daily; visit your dentist twice a year.  Eye exam- Visit your Optometrist or Ophthalmologist yearly.  Drink alcohol in moderation- Limit alcohol intake to one drink or less a day.  Never drink and drive.  Depression- Your emotional  health is as important as your physical health.  If you're feeling down or losing interest in things you normally enjoy, please talk to your healthcare provider.  Seat Belts- can save your life; always wear one  Smoke/Carbon Monoxide detectors- These detectors need to be installed on the appropriate level of your home.  Replace batteries at least once a year.  Violence- If anyone is threatening or hurting you, please tell your healthcare provider.  Living Will/ Health care power of attorney- Discuss with your healthcare provider and family.  

## 2016-02-10 NOTE — Progress Notes (Signed)
Subjective:    Patient ID: Deborah Ramirez, female    DOB: Sep 07, 1965, 51 y.o.   MRN: [id number]  02/10/2016  Annual Exam   HPI This 51 y.o. female presents for Complete Physical Examination.  Last physical:  2013 Pap smear: 2013 Mammogram: 11/2015 Colonoscopy: 2008 Eye exam:  Every year; recent visit 10/2015 Dental exam: every six months.  Immunization History  Administered Date(s) Administered  . Tdap 02/10/2016   BP Readings from Last 3 Encounters:  02/10/16 126/78  09/28/15 116/74  08/20/14 110/68   Wt Readings from Last 3 Encounters:  02/10/16 151 lb (68.5 kg)  09/28/15 137 lb (62.1 kg)  08/20/14 147 lb 9.6 oz (67 kg)   REFUSES FLU VACCINES.  Review of Systems  Constitutional: Negative for activity change, appetite change, chills, diaphoresis, fatigue, fever and unexpected weight change.  HENT: Positive for congestion and sinus pressure. Negative for dental problem, drooling, ear discharge, ear pain, facial swelling, hearing loss, mouth sores, nosebleeds, postnasal drip, rhinorrhea, sneezing, sore throat, tinnitus, trouble swallowing and voice change.   Eyes: Negative for photophobia, pain, discharge, redness, itching and visual disturbance.  Respiratory: Positive for cough. Negative for apnea, choking, chest tightness, shortness of breath, wheezing and stridor.   Cardiovascular: Negative for chest pain, palpitations and leg swelling.  Gastrointestinal: Negative for abdominal distention, abdominal pain, anal bleeding, blood in stool, constipation, diarrhea, nausea, rectal pain and vomiting.  Endocrine: Negative for cold intolerance, heat intolerance, polydipsia, polyphagia and polyuria.  Genitourinary: Negative for decreased urine volume, difficulty urinating, dyspareunia, dysuria, enuresis, flank pain, frequency, genital sores, hematuria, menstrual problem, pelvic pain, urgency, vaginal bleeding, vaginal discharge and vaginal pain.  Musculoskeletal: Negative for  arthralgias, back pain, gait problem, joint swelling, myalgias, neck pain and neck stiffness.  Skin: Negative for color change, pallor, rash and wound.  Allergic/Immunologic: Positive for environmental allergies. Negative for food allergies and immunocompromised state.  Neurological: Negative for dizziness, tremors, seizures, syncope, facial asymmetry, speech difficulty, weakness, light-headedness, numbness and headaches.  Hematological: Negative for adenopathy. Does not bruise/bleed easily.  Psychiatric/Behavioral: Negative for agitation, behavioral problems, confusion, decreased concentration, dysphoric mood, hallucinations, self-injury, sleep disturbance and suicidal ideas. The patient is not nervous/anxious and is not hyperactive.     Past Medical History:  Diagnosis Date  . Allergy   . Endometriosis 1990  . Fibromyalgia   . Hyperlipidemia   . Hypothyroidism   . Vitamin D deficiency    Past Surgical History:  Procedure Laterality Date  . APPENDECTOMY    . CHOLECYSTECTOMY    . OOPHORECTOMY     No Known Allergies Current Outpatient Prescriptions  Medication Sig Dispense Refill  . levothyroxine (SYNTHROID, LEVOTHROID) 75 MCG tablet take 1 tablet by mouth every morning before BREAKFAST 90 tablet 3   No current facility-administered medications for this visit.    Social History   Social History  . Marital status: Single    Spouse name: N/A  . Number of children: N/A  . Years of education: N/A   Occupational History  . employed [employer]   Social History Main Topics  . Smoking status: Never Smoker  . Smokeless tobacco: Never Used  . Alcohol use 1.2 oz/week    2 Glasses of wine per week  . Drug use: No  . Sexual activity: Yes     Comment: SSP   Other Topics Concern  . Not on file   Social History Narrative   Marital status: dating since 75; happy; no  domestic violence      Lives with: significant other/Cathy      Children:  None; dogs x  5      Employment: Quarry manager for [employer]      Tobacco: none      Alcohol: 4-8 ounces per week      Drugs: none      Exercise:  Yes in 2018; 3-4 times per week; running/lifting/walking.   Family History  Problem Relation Age of Onset  . Cancer Sister 17    gastric CA  . Diabetes Sister   . Hyperlipidemia Sister   . Cancer Maternal Grandmother 14    colon CA  . Stroke Mother   . Diabetes Father   . Heart disease Father     CHF  . Diabetes Brother   . Stroke Sister   . Multiple sclerosis Sister   . Raynaud syndrome Sister        Objective:    BP 126/78 (BP Location: Right Arm, Patient Position: Sitting, Cuff Size: Small)   Pulse 78   Temp 98.7 F (37.1 C) (Oral)   Resp 18   Ht 4' 10.5" (1.486 m)   Wt 151 lb (68.5 kg)   SpO2 99%   BMI 31.02 kg/m  Physical Exam  Constitutional: She is oriented to person, place, and time. She appears well-developed and well-nourished. No distress.  HENT:  Head: Normocephalic and atraumatic.  Right Ear: External ear normal.  Left Ear: External ear normal.  Nose: Nose normal.  Mouth/Throat: Oropharynx is clear and moist.  Eyes: Conjunctivae and EOM are normal. Pupils are equal, round, and reactive to light.  Neck: Normal range of motion and full passive range of motion without pain. Neck supple. No JVD present. Carotid bruit is not present. No thyromegaly present.  Cardiovascular: Normal rate, regular rhythm and normal heart sounds.  Exam reveals no gallop and no friction rub.   No murmur heard. Pulmonary/Chest: Effort normal and breath sounds normal. She has no wheezes. She has no rales. Right breast exhibits no inverted nipple, no mass, no nipple discharge, no skin change and no tenderness. Left breast exhibits no inverted nipple, no mass, no nipple discharge, no skin change and no tenderness. Breasts are symmetrical.  Abdominal: Soft. Bowel sounds are normal. She exhibits no distension and no mass. There is no tenderness.  There is no rebound and no guarding.  Musculoskeletal:       Right shoulder: Normal.       Left shoulder: Normal.       Cervical back: Normal.  Lymphadenopathy:    She has no cervical adenopathy.  Neurological: She is alert and oriented to person, place, and time. She has normal reflexes. No cranial nerve deficit. She exhibits normal muscle tone. Coordination normal.  Skin: Skin is warm and dry. No rash noted. She is not diaphoretic. No erythema. No pallor.  Psychiatric: She has a normal mood and affect. Her behavior is normal. Judgment and thought content normal.  Nursing note and vitals reviewed.  Results for orders placed or performed in visit on 09/28/15  TSH  Result Value Ref Range   TSH 5.70 (H) mIU/L   Depression screen [facility] 2/9 02/10/2016 02/10/2016 09/28/2015 08/20/2014  Decreased Interest 0 0 0 0  Down, Depressed, Hopeless 0 0 0 0  PHQ - 2 Score 0 0 0 0   Fall Risk  02/10/2016 02/10/2016 09/28/2015 08/20/2014  Falls in the past year? No No No No  Assessment & Plan:   1. Routine physical examination   2. Hypothyroidism due to acquired atrophy of thyroid   3. Other neutropenia ([place])   4. Pure hypercholesterolemia   5. Encounter for gynecological examination without abnormal finding   6. Screening for diabetes mellitus   7. Need for Tdap vaccination     Orders Placed This Encounter  Procedures  . Tdap vaccine greater than or equal to 7yo IM  . CBC with Differential/Platelet  . Comprehensive metabolic panel    Order Specific Question:   Has the patient fasted?    Answer:   Yes  . Hemoglobin A1c  . Lipid panel    Order Specific Question:   Has the patient fasted?    Answer:   Yes  . TSH  . T4, free  . POCT urinalysis dipstick   Meds ordered this encounter  Medications  . levothyroxine (SYNTHROID, LEVOTHROID) 75 MCG tablet    Sig: take 1 tablet by mouth every morning before BREAKFAST    Dispense:  90 tablet    Refill:  3    Return in about 1 year (around  02/09/2017) for complete physical examiniation.   Deborah Ramirez, M.D. Primary Care at [facility] [address] [place], [place]  [address] [phone] phone [phone] fax

## 2016-02-11 LAB — COMPREHENSIVE METABOLIC PANEL
A/G RATIO: 1.4 (ref 1.2–2.2)
ALK PHOS: 68 IU/L (ref 39–117)
ALT: 33 IU/L — ABNORMAL HIGH (ref 0–32)
AST: 27 IU/L (ref 0–40)
Albumin: 4.5 g/dL (ref 3.5–5.5)
BILIRUBIN TOTAL: 0.3 mg/dL (ref 0.0–1.2)
BUN/Creatinine Ratio: 25 — ABNORMAL HIGH (ref 9–23)
BUN: 17 mg/dL (ref 6–24)
CHLORIDE: 99 mmol/L (ref 96–106)
CO2: 22 mmol/L (ref 18–29)
Calcium: 9.4 mg/dL (ref 8.7–10.2)
Creatinine, Ser: 0.67 mg/dL (ref 0.57–1.00)
GFR calc Af Amer: 117 mL/min/{1.73_m2} (ref >59)
GFR, EST NON AFRICAN AMERICAN: 101 mL/min/{1.73_m2} (ref >59)
GLOBULIN, TOTAL: 3.2 g/dL (ref 1.5–4.5)
Glucose: 93 mg/dL (ref 65–99)
POTASSIUM: 4.1 mmol/L (ref 3.5–5.2)
SODIUM: 138 mmol/L (ref 134–144)
Total Protein: 7.7 g/dL (ref 6.0–8.5)

## 2016-02-11 LAB — CBC WITH DIFFERENTIAL/PLATELET
BASOS: 2 %
Basophils Absolute: 0.1 10*3/uL (ref 0.0–0.2)
EOS (ABSOLUTE): 0.1 10*3/uL (ref 0.0–0.4)
EOS: 3 %
HEMATOCRIT: 37.7 % (ref 34.0–46.6)
Hemoglobin: 12.4 g/dL (ref 11.1–15.9)
Immature Grans (Abs): 0 10*3/uL (ref 0.0–0.1)
Immature Granulocytes: 0 %
LYMPHS ABS: 0.9 10*3/uL (ref 0.7–3.1)
Lymphs: 27 %
MCH: 29.5 pg (ref 26.6–33.0)
MCHC: 32.9 g/dL (ref 31.5–35.7)
MCV: 90 fL (ref 79–97)
MONOS ABS: 0.3 10*3/uL (ref 0.1–0.9)
Monocytes: 8 %
Neutrophils Absolute: 2.1 10*3/uL (ref 1.4–7.0)
Neutrophils: 60 %
Platelets: 336 10*3/uL (ref 150–379)
RBC: 4.2 x10E6/uL (ref 3.77–5.28)
RDW: 13.2 % (ref 12.3–15.4)
WBC: 3.4 10*3/uL (ref 3.4–10.8)

## 2016-02-11 LAB — HEMOGLOBIN A1C
ESTIMATED AVERAGE GLUCOSE: 111 mg/dL
HEMOGLOBIN A1C: 5.5 % (ref 4.8–5.6)

## 2016-02-11 LAB — LIPID PANEL
Chol/HDL Ratio: 3 ratio units (ref 0.0–4.4)
Cholesterol, Total: 190 mg/dL (ref 100–199)
HDL: 63 mg/dL (ref >39)
LDL Calculated: 101 mg/dL — ABNORMAL HIGH (ref 0–99)
Triglycerides: 131 mg/dL (ref 0–149)
VLDL CHOLESTEROL CAL: 26 mg/dL (ref 5–40)

## 2016-02-11 LAB — TSH: TSH: 8.53 u[IU]/mL — AB (ref 0.450–4.500)

## 2016-02-11 LAB — T4, FREE: FREE T4: 1.41 ng/dL (ref 0.82–1.77)

## 2016-02-14 LAB — PAP IG AND HPV HIGH-RISK
HPV, high-risk: NEGATIVE
PAP SMEAR COMMENT: 0

## 2016-11-01 ENCOUNTER — Other Ambulatory Visit: Payer: Self-pay | Admitting: Family Medicine

## 2016-11-01 DIAGNOSIS — Z1231 Encounter for screening mammogram for malignant neoplasm of breast: Secondary | ICD-10-CM

## 2016-11-22 ENCOUNTER — Ambulatory Visit: Payer: 59

## 2016-12-24 ENCOUNTER — Ambulatory Visit
Admission: RE | Admit: 2016-12-24 | Discharge: 2016-12-24 | Disposition: A | Discharge status: Home / Self Care | Payer: 59 | Source: Ambulatory Visit | Attending: Family Medicine | Admitting: Family Medicine

## 2016-12-24 DIAGNOSIS — Z1231 Encounter for screening mammogram for malignant neoplasm of breast: Secondary | ICD-10-CM | POA: Diagnosis not present

## 2017-01-10 ENCOUNTER — Encounter: Payer: Self-pay | Admitting: Internal Medicine

## 2017-02-22 ENCOUNTER — Ambulatory Visit: Payer: 59 | Admitting: Family Medicine

## 2017-02-22 ENCOUNTER — Other Ambulatory Visit: Payer: Self-pay

## 2017-02-22 ENCOUNTER — Encounter: Payer: Self-pay | Admitting: Family Medicine

## 2017-02-22 VITALS — BP 132/92 | HR 66 | Temp 97.5°F | Resp 16 | Ht 58.5 in | Wt 146.8 lb

## 2017-02-22 DIAGNOSIS — E034 Atrophy of thyroid (acquired): Secondary | ICD-10-CM | POA: Diagnosis not present

## 2017-02-22 NOTE — Progress Notes (Signed)
Subjective:  By signing my name below, I, Deborah Ramirez, attest that this documentation has been prepared under the direction and in the presence of Delman Cheadle, MD Electronically Signed: Ladene Artist, ED Scribe 02/22/2017 at 8:49 AM.   Patient ID: Deborah Ramirez, female    DOB: 14-Jun-1965, 52 y.o.   MRN: [id number]  Chief Complaint  Patient presents with  . Medication Refill    levothyroxine and f/u on mediction   HPI Deborah Ramirez is a 52 y.o. female who presents to Primary Care at [facility] for medication refill. Last labs 1 yr prior when seen by Dr. Tamala Julian for CPE. TSH was elevated. On levothyroxine 75. Dose was unchanged. --- Denies fatigue, unexpected weight change, heat/cold intolerance, changes in skin, hair or nails.  Wt Readings from Last 3 Encounters:  02/22/17 146 lb 12.8 oz (66.6 kg)  02/10/16 151 lb (68.5 kg)  09/28/15 137 lb (62.1 kg)   Past Medical History:  Diagnosis Date  . Allergy   . Endometriosis 1990  . Fibromyalgia   . Hyperlipidemia   . Hypothyroidism   . Vitamin D deficiency    Current Outpatient Medications on File Prior to Visit  Medication Sig Dispense Refill  . levothyroxine (SYNTHROID, LEVOTHROID) 75 MCG tablet take 1 tablet by mouth every morning before BREAKFAST 90 tablet 3   No current facility-administered medications on file prior to visit.    No Known Allergies  Past Surgical History:  Procedure Laterality Date  . APPENDECTOMY    . CHOLECYSTECTOMY    . OOPHORECTOMY     Family History  Problem Relation Age of Onset  . Cancer Sister 6       gastric CA  . Diabetes Sister   . Hyperlipidemia Sister   . Cancer Maternal Grandmother 83       colon CA  . Stroke Mother   . Diabetes Father   . Heart disease Father        CHF  . Diabetes Brother   . Stroke Sister   . Multiple sclerosis Sister   . Raynaud syndrome Sister    Social History   Socioeconomic History  . Marital status: Single    Spouse name: None  . Number of  children: None  . Years of education: None  . Highest education level: None  Social Needs  . Financial resource strain: None  . Food insecurity - worry: None  . Food insecurity - inability: None  . Transportation needs - medical: None  . Transportation needs - non-medical: None  Occupational History  . Occupation: employed    Fish farm manager: Psychologist, sport and exercise    Comment: Engineer, structural  Tobacco Use  . Smoking status: Never Smoker  . Smokeless tobacco: Never Used  Substance and Sexual Activity  . Alcohol use: Yes    Alcohol/week: 1.2 oz    Types: 2 Glasses of wine per week  . Drug use: No  . Sexual activity: Yes    Comment: SSP  Other Topics Concern  . None  Social History Narrative   Marital status: dating since 24; happy; no domestic violence      Lives with: significant other/Cathy      Children:  None; dogs x 5      Employment: Quarry manager for [employer]      Tobacco: none      Alcohol: 4-8 ounces per week      Drugs: none      Exercise:  Yes in 2018; 3-4 times  per week; running/lifting/walking.   Depression screen [facility] 2/9 02/22/2017 02/10/2016 02/10/2016 09/28/2015 08/20/2014  Decreased Interest 0 0 0 0 0  Down, Depressed, Hopeless 0 0 0 0 0  PHQ - 2 Score 0 0 0 0 0     Review of Systems  Constitutional: Negative for activity change, appetite change, chills, diaphoresis, fatigue, fever and unexpected weight change.  HENT: Negative for trouble swallowing.   Cardiovascular: Negative for palpitations.  Gastrointestinal: Negative for constipation and diarrhea.  Endocrine: Negative for cold intolerance and heat intolerance.  Genitourinary: Negative for decreased urine volume and frequency.  Skin: Negative.  Negative for color change, pallor and rash.  Neurological: Negative for tremors, syncope, weakness and numbness.  Psychiatric/Behavioral: Negative for decreased concentration. The patient is not nervous/anxious.      Objective:   Physical Exam  Constitutional: She  is oriented to person, place, and time. She appears well-developed and well-nourished. No distress.  HENT:  Head: Normocephalic and atraumatic.  Eyes: Conjunctivae and EOM are normal.  Neck: Neck supple. No tracheal deviation present.  Cardiovascular: Normal rate and regular rhythm.  Pulmonary/Chest: Effort normal and breath sounds normal. No respiratory distress.  Musculoskeletal: Normal range of motion.  Neurological: She is alert and oriented to person, place, and time.  Skin: Skin is warm and dry.  Psychiatric: She has a normal mood and affect. Her behavior is normal.  Nursing note and vitals reviewed.  BP (!) 132/92 (BP Location: Right Arm, Patient Position: Sitting, Cuff Size: Normal)   Pulse 66   Temp (!) 97.5 F (36.4 C) (Oral)   Resp 16   Ht 4' 10.5" (1.486 m)   Wt 146 lb 12.8 oz (66.6 kg)   SpO2 99%   BMI 30.16 kg/m  Repeat BP: 120/84    Assessment & Plan:   1. Hypothyroidism due to acquired atrophy of thyroid   TSH wnml and pt asymptomatic. Cont same dose.  Orders Placed This Encounter  Procedures  . TSH+T4F+T3Free    Meds ordered this encounter  Medications  . levothyroxine (SYNTHROID, LEVOTHROID) 75 MCG tablet    Sig: take 1 tablet by mouth every morning before BREAKFAST    Dispense:  90 tablet    Refill:  3    I personally performed the services described in this documentation, which was scribed in my presence. The recorded information has been reviewed and considered, and addended by me as needed.   Delman Cheadle, M.D.  Primary Care at [facility] [address] [place], [place] [address] [phone] phone [phone] fax  02/23/17 2:48 PM

## 2017-02-22 NOTE — Patient Instructions (Addendum)
     IF you received an x-ray today, you will receive an invoice from [facility]. Please contact [facility] at [phone] with questions or concerns regarding your invoice.   IF you received labwork today, you will receive an invoice from [facility]. Please contact [facility] at [phone] with questions or concerns regarding your invoice.   Our billing staff will not be able to assist you with questions regarding bills from these companies.  You will be contacted with the lab results as soon as they are available. The fastest way to get your results is to activate your My Chart account. Instructions are located on the last page of this paperwork. If you have not heard from us regarding the results in 2 weeks, please contact this office.      Hypothyroidism Hypothyroidism is a disorder of the thyroid. The thyroid is a large gland that is located in the lower front of the neck. The thyroid releases hormones that control how the body works. With hypothyroidism, the thyroid does not make enough of these hormones. What are the causes? Causes of hypothyroidism may include:  Viral infections.  Pregnancy.  Your own defense system (immune system) attacking your thyroid.  Certain medicines.  Birth defects.  Past radiation treatments to your head or neck.  Past treatment with radioactive iodine.  Past surgical removal of part or all of your thyroid.  Problems with the gland that is located in the center of your brain (pituitary).  What are the signs or symptoms? Signs and symptoms of hypothyroidism may include:  Feeling as though you have no energy (lethargy).  Inability to tolerate cold.  Weight gain that is not explained by a change in diet or exercise habits.  Dry skin.  Coarse hair.  Menstrual irregularity.  Slowing of thought processes.  Constipation.  Sadness or depression.  How is this diagnosed? Your health care provider may diagnose  hypothyroidism with blood tests and ultrasound tests. How is this treated? Hypothyroidism is treated with medicine that replaces the hormones that your body does not make. After you begin treatment, it may take several weeks for symptoms to go away. Follow these instructions at home:  Take medicines only as directed by your health care provider.  If you start taking any new medicines, tell your health care provider.  Keep all follow-up visits as directed by your health care provider. This is important. As your condition improves, your dosage needs may change. You will need to have blood tests regularly so that your health care provider can watch your condition. Contact a health care provider if:  Your symptoms do not get better with treatment.  You are taking thyroid replacement medicine and: ? You sweat excessively. ? You have tremors. ? You feel anxious. ? You lose weight rapidly. ? You cannot tolerate heat. ? You have emotional swings. ? You have diarrhea. ? You feel weak. Get help right away if:  You develop chest pain.  You develop an irregular heartbeat.  You develop a rapid heartbeat. This information is not intended to replace advice given to you by your health care provider. Make sure you discuss any questions you have with your health care provider. Document Released: 12/31/2004 Document Revised: 06/08/2015 Document Reviewed: 05/18/2013 Elsevier Interactive Patient Education  2018 Elsevier Inc.  

## 2017-02-23 LAB — TSH+T4F+T3FREE
Free T4: 1.5 ng/dL (ref 0.82–1.77)
T3, Free: 2.7 pg/mL (ref 2.0–4.4)
TSH: 3.74 u[IU]/mL (ref 0.450–4.500)

## 2017-02-23 MED ORDER — LEVOTHYROXINE SODIUM 75 MCG PO TABS: ORAL_TABLET | ORAL | 3 refills | Status: DC

## 2017-02-25 ENCOUNTER — Encounter: Payer: Self-pay | Admitting: Radiology

## 2017-03-18 ENCOUNTER — Encounter: Payer: Self-pay | Admitting: Internal Medicine

## 2017-05-30 ENCOUNTER — Other Ambulatory Visit: Payer: Self-pay

## 2017-05-30 ENCOUNTER — Ambulatory Visit (AMBULATORY_SURGERY_CENTER): Payer: Self-pay | Admitting: *Deleted

## 2017-05-30 VITALS — Ht <= 58 in | Wt 143.0 lb

## 2017-05-30 DIAGNOSIS — Z1211 Encounter for screening for malignant neoplasm of colon: Secondary | ICD-10-CM

## 2017-05-30 NOTE — Progress Notes (Signed)
No egg or soy allergy  No email- not registered in [place]  No diet medications taken  No home oxygen used or hx of sleep apnea  No anesthesia or intubation problems per pt.

## 2017-06-05 ENCOUNTER — Encounter: Payer: Self-pay | Admitting: Internal Medicine

## 2017-06-09 ENCOUNTER — Encounter: Payer: Self-pay | Admitting: Family Medicine

## 2017-06-13 ENCOUNTER — Encounter: Payer: Self-pay | Admitting: Internal Medicine

## 2017-06-13 ENCOUNTER — Other Ambulatory Visit: Payer: Self-pay

## 2017-06-13 ENCOUNTER — Ambulatory Visit (AMBULATORY_SURGERY_CENTER): Payer: 59 | Admitting: Internal Medicine

## 2017-06-13 VITALS — BP 109/53 | HR 61 | Temp 98.4°F | Resp 16 | Ht <= 58 in | Wt 143.0 lb

## 2017-06-13 DIAGNOSIS — D122 Benign neoplasm of ascending colon: Secondary | ICD-10-CM | POA: Diagnosis not present

## 2017-06-13 DIAGNOSIS — Z1211 Encounter for screening for malignant neoplasm of colon: Secondary | ICD-10-CM | POA: Diagnosis present

## 2017-06-13 DIAGNOSIS — D123 Benign neoplasm of transverse colon: Secondary | ICD-10-CM | POA: Diagnosis not present

## 2017-06-13 MED ORDER — SODIUM CHLORIDE 0.9 % IV SOLN: 500.0000 mL | Freq: Once | INTRAVENOUS | Status: DC

## 2017-06-13 NOTE — Op Note (Signed)
[facility] Patient Name: Deborah Ramirez Procedure Date: 06/13/2017 10:56 AM MRN: [id number] Endoscopist: Gatha Mayer , MD Age: 52 Referring MD:  Date of Birth: 1965-05-29 Gender: Female Account #: [id number] Procedure:                Colonoscopy Indications:              Screening for colorectal malignant neoplasm Medicines:                Propofol per Anesthesia, Monitored Anesthesia Care Procedure:                Pre-Anesthesia Assessment:                           - Prior to the procedure, a History and Physical                            was performed, and patient medications and                            allergies were reviewed. The patient's tolerance of                            previous anesthesia was also reviewed. The risks                            and benefits of the procedure and the sedation                            options and risks were discussed with the patient.                            All questions were answered, and informed consent                            was obtained. Prior Anticoagulants: The patient has                            taken no previous anticoagulant or antiplatelet                            agents. ASA Grade Assessment: II - A patient with                            mild systemic disease. After reviewing the risks                            and benefits, the patient was deemed in                            satisfactory condition to undergo the procedure.                           After obtaining informed consent, the colonoscope  was passed under direct vision. Throughout the                            procedure, the patient's blood pressure, pulse, and                            oxygen saturations were monitored continuously. The                            Colonoscope was introduced through the anus and                            advanced to the the cecum, identified by   appendiceal orifice and ileocecal valve. The                            colonoscopy was performed without difficulty. The                            patient tolerated the procedure well. The quality                            of the bowel preparation was excellent. The                            ileocecal valve, appendiceal orifice, and rectum                            were photographed. The bowel preparation used was                            Miralax. Scope In: 11:09:20 AM Scope Out: 11:25:15 AM Scope Withdrawal Time: 0 hours 12 minutes 1 second  Total Procedure Duration: 0 hours 15 minutes 55 seconds  Findings:                 The perianal and digital rectal examinations were                            normal.                           Two sessile polyps were found in the transverse                            colon and ascending colon. The polyps were 1 to 2                            mm in size. These polyps were removed with a cold                            biopsy forceps. Resection and retrieval were                            complete. Verification of patient identification  for the specimen was done. Estimated blood loss was                            minimal.                           Multiple diverticula were found in the sigmoid                            colon.                           Internal hemorrhoids were found during retroflexion.                           The exam was otherwise without abnormality on                            direct and retroflexion views. Complications:            No immediate complications. Estimated Blood Loss:     Estimated blood loss was minimal. Impression:               - Two 1 to 2 mm polyps in the transverse colon and                            in the ascending colon, removed with a cold biopsy                            forceps. Resected and retrieved.                           - Diverticulosis in the sigmoid  colon.                           - Internal hemorrhoids.                           - The examination was otherwise normal on direct                            and retroflexion views. Recommendation:           - Patient has a contact number available for                            emergencies. The signs and symptoms of potential                            delayed complications were discussed with the                            patient. Return to normal activities tomorrow.                            Written discharge instructions were provided to the  patient.                           - Resume previous diet.                           - Continue present medications.                           - Repeat colonoscopy is recommended for                            surveillance. The colonoscopy date will be                            determined after pathology results from today's                            exam become available for review. Gatha Mayer, MD 06/13/2017 11:32:40 AM This report has been signed electronically.

## 2017-06-13 NOTE — Progress Notes (Signed)
Called to room to assist during endoscopic procedure.  Patient ID and intended procedure confirmed with present staff. Received instructions for my participation in the procedure from the performing physician.  

## 2017-06-13 NOTE — Progress Notes (Signed)
Pt's states no medical or surgical changes since previsit or office visit. 

## 2017-06-13 NOTE — Progress Notes (Signed)
A and O x3. Report to RN. Tolerated MAC anesthesia well.

## 2017-06-13 NOTE — Patient Instructions (Addendum)
I found and removed 2 tiny polyps - I will let you know pathology results and when to have another routine colonoscopy by mail and/or My Chart.  You also have diverticulosis - thickened muscle rings and pouches in the colon wall. Please read the handout about this condition.  I appreciate the opportunity to care for you. Gatha Mayer, MD, [facility]  Polyp handout given to patient. Diverticulosis handout given to patient. Hemorrhoid handout given to patient.  Resume previous diet.Continue present medications.  Repeat colonoscopy recommended for surveillance.  Date to be determined after pathology results reviewed.  YOU HAD AN ENDOSCOPIC PROCEDURE TODAY AT [facility]:   Refer to the procedure report that was given to you for any specific questions about what was found during the examination.  If the procedure report does not answer your questions, please call your gastroenterologist to clarify.  If you requested that your care partner not be given the details of your procedure findings, then the procedure report has been included in a sealed envelope for you to review at your convenience later.  YOU SHOULD EXPECT: Some feelings of bloating in the abdomen. Passage of more gas than usual.  Walking can help get rid of the air that was put into your GI tract during the procedure and reduce the bloating. If you had a lower endoscopy (such as a colonoscopy or flexible sigmoidoscopy) you may notice spotting of blood in your stool or on the toilet paper. If you underwent a bowel prep for your procedure, you may not have a normal bowel movement for a few days.  Please Note:  You might notice some irritation and congestion in your nose or some drainage.  This is from the oxygen used during your procedure.  There is no need for concern and it should clear up in a day or so.  SYMPTOMS TO REPORT IMMEDIATELY:   Following lower endoscopy (colonoscopy or flexible  sigmoidoscopy):  Excessive amounts of blood in the stool  Significant tenderness or worsening of abdominal pains  Swelling of the abdomen that is new, acute  Fever of 100F or higher For urgent or emergent issues, a gastroenterologist can be reached at any hour by calling [phone].   DIET:  We do recommend a small meal at first, but then you may proceed to your regular diet.  Drink plenty of fluids but you should avoid alcoholic beverages for 24 hours.  ACTIVITY:  You should plan to take it easy for the rest of today and you should NOT DRIVE or use heavy machinery until tomorrow (because of the sedation medicines used during the test).    FOLLOW UP: Our staff will call the number listed on your records the next business day following your procedure to check on you and address any questions or concerns that you may have regarding the information given to you following your procedure. If we do not reach you, we will leave a message.  However, if you are feeling well and you are not experiencing any problems, there is no need to return our call.  We will assume that you have returned to your regular daily activities without incident.  If any biopsies were taken you will be contacted by phone or by letter within the next 1-3 weeks.  Please call us at [phone] if you have not heard about the biopsies in 3 weeks.    SIGNATURES/CONFIDENTIALITY: You and/or your care partner have signed paperwork which will be entered  into your electronic medical record.  These signatures attest to the fact that that the information above on your After Visit Summary has been reviewed and is understood.  Full responsibility of the confidentiality of this discharge information lies with you and/or your care-partner.

## 2017-06-16 ENCOUNTER — Telehealth: Payer: Self-pay

## 2017-06-16 NOTE — Telephone Encounter (Signed)
Attempted to reach pt. With follow-up call following endoscopic procedure 06/13/2017.  LM on pt. Voice mail.  Will try to reach pt. Again later today.

## 2017-06-16 NOTE — Telephone Encounter (Signed)
  Follow up Call-  Call back number 06/13/2017  Post procedure Call Back phone  # [phone]  Permission to leave phone message Yes  Some recent data might be hidden     Patient questions:  Do you have a fever, pain , or abdominal swelling? No. Pain Score  0 *  Have you tolerated food without any problems? Yes.    Have you been able to return to your normal activities? Yes.    Do you have any questions about your discharge instructions: Diet   No. Medications  No. Follow up visit  No.  Do you have questions or concerns about your Care? Yes.  patient IV site was black  and blue and very sore on Saturday and Sunday. Today is much better. Advised using ice if it still is sore.  Actions: * If pain score is 4 or above: No action needed, pain <4.

## 2017-06-22 ENCOUNTER — Encounter: Payer: Self-pay | Admitting: Internal Medicine

## 2017-06-22 DIAGNOSIS — Z860101 Personal history of adenomatous and serrated colon polyps: Secondary | ICD-10-CM

## 2017-06-22 DIAGNOSIS — Z86010 Personal history of colonic polyps: Secondary | ICD-10-CM

## 2017-06-22 HISTORY — DX: Personal history of adenomatous and serrated colon polyps: Z86.0101

## 2017-06-22 HISTORY — DX: Personal history of colonic polyps: Z86.010

## 2017-06-22 NOTE — Progress Notes (Signed)
2 diminutive adenomas Recall 2024

## 2018-01-20 ENCOUNTER — Telehealth: Payer: Self-pay | Admitting: Family Medicine

## 2018-01-20 ENCOUNTER — Other Ambulatory Visit: Payer: Self-pay | Admitting: Family Medicine

## 2018-01-20 MED ORDER — LEVOTHYROXINE SODIUM 75 MCG PO TABS: ORAL_TABLET | ORAL | 0 refills | Status: DC

## 2018-01-20 NOTE — Telephone Encounter (Signed)
Copied from [place] [id number]. Topic: Quick Communication - Rx Refill/Question >> Jan 20, 2018 11:06 AM Leward Quan A wrote: Medication: levothyroxine (SYNTHROID, LEVOTHROID) 75 MCG tablet   Patient is requesting a refill until she can get in to see a doctor  Has the patient contacted their pharmacy? Yes.   (Agent: If no, request that the patient contact the pharmacy for the refill.) (Agent: If yes, when and what did the pharmacy advise?)  Preferred Pharmacy (with phone number or street name): [id number] [address], [place] [phone] (Phone) [phone] (Fax)    Agent: Please be advised that RX refills may take up to 3 business days. We ask that you follow-up with your pharmacy.

## 2018-01-20 NOTE — Telephone Encounter (Signed)
Copied from [place] [id number]. Topic: Quick Communication - Rx Refill/Question >> Jan 20, 2018 11:06 AM Leward Quan A wrote: Medication: levothyroxine (SYNTHROID, LEVOTHROID) 75 MCG tablet   Patient is requesting a refill until she can get in to see a doctor  Has the patient contacted their pharmacy? Yes.   (Agent: If no, request that the patient contact the pharmacy for the refill.) (Agent: If yes, when and what did the pharmacy advise?)  Preferred Pharmacy (with phone number or street name): [id number] [address], [place] [phone] (Phone) [phone] (Fax)    Agent: Please be advised that RX refills may take up to 3 business days. We ask that you follow-up with your pharmacy.

## 2018-01-20 NOTE — Telephone Encounter (Signed)
Courtesy refill given. Appt scheduled on 02/19/18

## 2018-02-06 ENCOUNTER — Telehealth: Payer: Self-pay | Admitting: Family Medicine

## 2018-02-06 NOTE — Telephone Encounter (Signed)
LVM for pt to call the office and reschedule their appt that was scheduled for 02/18/18 with Dr. Brigitte Pulse. Due to Dr. Brigitte Pulse being out on leave, we will need to either reschedule with a different provider or if it can wait, she may schedule in March with Dr. Brigitte Pulse. When pt calls back, please reschedule with whichever the pt would like to do. Thank you!

## 2018-02-19 ENCOUNTER — Ambulatory Visit: Payer: 59 | Admitting: Family Medicine

## 2018-02-20 NOTE — Telephone Encounter (Signed)
Pt calling to check status. Pt would like to know if she can get something called in until she can make an appointment. Please advise

## 2018-02-21 NOTE — Telephone Encounter (Signed)
Rx has been sent to pharmacy

## 2018-04-25 ENCOUNTER — Other Ambulatory Visit: Payer: Self-pay | Admitting: Family Medicine

## 2018-04-25 NOTE — Telephone Encounter (Signed)
Last ov 1 year ago

## 2018-05-14 ENCOUNTER — Other Ambulatory Visit: Payer: Self-pay | Admitting: Family Medicine

## 2018-05-22 ENCOUNTER — Ambulatory Visit: Payer: 59 | Admitting: Family Medicine

## 2019-01-13 ENCOUNTER — Other Ambulatory Visit: Payer: Self-pay | Admitting: Physician Assistant

## 2019-01-13 ENCOUNTER — Other Ambulatory Visit: Payer: Self-pay | Admitting: Family Medicine

## 2019-01-13 DIAGNOSIS — Z1231 Encounter for screening mammogram for malignant neoplasm of breast: Secondary | ICD-10-CM

## 2019-02-25 ENCOUNTER — Ambulatory Visit: Payer: 59

## 2019-03-16 ENCOUNTER — Other Ambulatory Visit: Payer: Self-pay

## 2019-03-16 ENCOUNTER — Ambulatory Visit
Admission: RE | Admit: 2019-03-16 | Discharge: 2019-03-16 | Disposition: A | Discharge status: Home / Self Care | Payer: 59 | Source: Ambulatory Visit | Attending: Physician Assistant | Admitting: Physician Assistant

## 2019-03-16 DIAGNOSIS — Z1231 Encounter for screening mammogram for malignant neoplasm of breast: Secondary | ICD-10-CM

## 2021-03-22 ENCOUNTER — Other Ambulatory Visit: Payer: Self-pay | Admitting: Physician Assistant

## 2021-03-22 DIAGNOSIS — Z1231 Encounter for screening mammogram for malignant neoplasm of breast: Secondary | ICD-10-CM

## 2021-04-03 ENCOUNTER — Ambulatory Visit
Admission: RE | Admit: 2021-04-03 | Discharge: 2021-04-03 | Disposition: A | Discharge status: Home / Self Care | Payer: 59 | Source: Ambulatory Visit | Attending: Physician Assistant | Admitting: Physician Assistant

## 2021-04-03 DIAGNOSIS — Z1231 Encounter for screening mammogram for malignant neoplasm of breast: Secondary | ICD-10-CM

## 2022-03-04 ENCOUNTER — Other Ambulatory Visit: Payer: Self-pay | Admitting: Physician Assistant

## 2022-03-04 DIAGNOSIS — Z1231 Encounter for screening mammogram for malignant neoplasm of breast: Secondary | ICD-10-CM

## 2022-04-15 ENCOUNTER — Ambulatory Visit
Admission: RE | Admit: 2022-04-15 | Discharge: 2022-04-15 | Disposition: A | Discharge status: Home / Self Care | Payer: 59 | Source: Ambulatory Visit | Attending: Physician Assistant | Admitting: Physician Assistant

## 2022-04-15 DIAGNOSIS — Z1231 Encounter for screening mammogram for malignant neoplasm of breast: Secondary | ICD-10-CM

## 2022-06-21 ENCOUNTER — Encounter: Payer: Self-pay | Admitting: Internal Medicine

## 2022-07-11 ENCOUNTER — Ambulatory Visit (AMBULATORY_SURGERY_CENTER): Payer: 59

## 2022-07-11 ENCOUNTER — Encounter: Payer: Self-pay | Admitting: Internal Medicine

## 2022-07-11 VITALS — Ht 59.0 in | Wt 131.0 lb

## 2022-07-11 DIAGNOSIS — Z86010 Personal history of colonic polyps: Secondary | ICD-10-CM

## 2022-07-11 NOTE — Progress Notes (Signed)

## 2022-08-02 ENCOUNTER — Encounter: Payer: 59 | Admitting: Internal Medicine

## 2022-08-04 ENCOUNTER — Encounter: Payer: Self-pay | Admitting: Certified Registered Nurse Anesthetist

## 2022-08-07 ENCOUNTER — Encounter: Payer: Self-pay | Admitting: Internal Medicine

## 2022-08-07 ENCOUNTER — Ambulatory Visit (AMBULATORY_SURGERY_CENTER): Payer: 59 | Admitting: Internal Medicine

## 2022-08-07 VITALS — BP 109/65 | HR 67 | Temp 96.9°F | Resp 21 | Ht <= 58 in | Wt 131.0 lb

## 2022-08-07 DIAGNOSIS — Z800 Family history of malignant neoplasm of digestive organs: Secondary | ICD-10-CM | POA: Insufficient documentation

## 2022-08-07 DIAGNOSIS — Z86010 Personal history of colonic polyps: Secondary | ICD-10-CM | POA: Diagnosis not present

## 2022-08-07 DIAGNOSIS — Z09 Encounter for follow-up examination after completed treatment for conditions other than malignant neoplasm: Secondary | ICD-10-CM

## 2022-08-07 MED ORDER — SODIUM CHLORIDE 0.9 % IV SOLN: 500.0000 mL | INTRAVENOUS | Status: DC

## 2022-08-07 NOTE — Progress Notes (Signed)
Pt's states no medical or surgical changes since previsit or office visit. 

## 2022-08-07 NOTE — Progress Notes (Signed)
Report given to PACU, vss 

## 2022-08-07 NOTE — Progress Notes (Signed)
[facility] History and Physical   Primary Care Physician:  Morrell Riddle, PA-C   Reason for Procedure:   Hx adenomatous polyps  Plan:    colonoscopy     HPI: Deborah Ramirez is a 57 y.o. female here for surveillance colonoscopy   Past Medical History:  Diagnosis Date   Allergy    Endometriosis 1990   Hx of adenomatous colonic polyps 06/22/2017   Hyperlipidemia    no medications   Hypothyroidism    hypothyroid   Vitamin D deficiency     Past Surgical History:  Procedure Laterality Date   APPENDECTOMY     CHOLECYSTECTOMY     pt told that "gallblader" grew back   COLONOSCOPY     OOPHORECTOMY  1989   removed part of small intestine    Prior to Admission medications   Medication Sig Start Date End Date Taking? Authorizing Provider  levothyroxine (SYNTHROID, LEVOTHROID) 75 MCG tablet TAKE 1 TABLET BY MOUTH EVERY MORNING BEFORE BREAKFAST 04/27/18  Yes Doristine Bosworth, MD    Current Outpatient Medications  Medication Sig Dispense Refill   levothyroxine (SYNTHROID, LEVOTHROID) 75 MCG tablet TAKE 1 TABLET BY MOUTH EVERY MORNING BEFORE BREAKFAST 15 tablet 0   Current Facility-Administered Medications  Medication Dose Route Frequency Provider Last Rate Last Admin   0.9 %  sodium chloride infusion  500 mL Intravenous Once Iva Boop, MD       0.9 %  sodium chloride infusion  500 mL Intravenous Continuous Iva Boop, MD        Allergies as of 08/07/2022   (No Known Allergies)    Family History  Problem Relation Age of Onset   Stroke Mother    Diabetes Father    Heart disease Father        CHF   Stomach cancer Sister    Cancer Sister 65       gastric CA   Diabetes Sister    Hyperlipidemia Sister    Stroke Sister    Multiple sclerosis Sister    Raynaud syndrome Sister    Diabetes Brother    Colon cancer Paternal Aunt    Cancer Maternal Grandmother 81       colon CA   Colon cancer Paternal Grandmother    Colon cancer Cousin    Colon  polyps Neg Hx    Esophageal cancer Neg Hx    Rectal cancer Neg Hx     Social History   Socioeconomic History   Marital status: Single    Spouse name: Not on file   Number of children: Not on file   Years of education: Not on file   Highest education level: Not on file  Occupational History   Occupation: employed    Employer: [employer]    Comment: Emergency planning/management officer  Tobacco Use   Smoking status: Never   Smokeless tobacco: Never  Vaping Use   Vaping status: Never Used  Substance and Sexual Activity   Alcohol use: Yes    Alcohol/week: 2.0 standard drinks of alcohol    Types: 2 Glasses of wine per week    Comment: ocassional   Drug use: No   Sexual activity: Yes    Comment: SSP  Other Topics Concern   Not on file  Social History Narrative   Marital status: dating since 8; happy; no domestic violence      Lives with: significant other/Cathy      Children:  None; dogs x 5  Employment: Midwife for [employer]      Tobacco: none      Alcohol: 4-8 ounces per week      Drugs: none      Exercise:  Yes in 2018; 3-4 times per week; running/lifting/walking.   Social Determinants of Health   Financial Resource Strain: Patient Declined (06/23/2022)   Received from [facility], [facility]   Overall Financial Resource Strain (CARDIA)    Difficulty of Paying Living Expenses: Patient declined  Food Insecurity: Patient Declined (06/23/2022)   Received from [facility], [facility]   Hunger Vital Sign    Worried About Running Out of Food in the Last Year: Patient declined    Ran Out of Food in the Last Year: Patient declined  Transportation Needs: Patient Declined (06/23/2022)   Received from [facility], [facility]   [facility] - Transportation    Lack of Transportation (Medical): Patient declined    Lack of Transportation (Non-Medical): Patient declined  Physical Activity: Insufficiently Active (06/23/2022)   Received from [facility], [facility]    Exercise Vital Sign    Days of Exercise per Week: 5 days    Minutes of Exercise per Session: 20 min  Stress: Stress Concern Present (06/23/2022)   Received from [facility], [facility] - Occupational Stress Questionnaire    Feeling of Stress : To some extent  Social Connections: Moderately Integrated (06/23/2022)   Received from [facility], [facility]   Social Network    How would you rate your social network (family, work, friends)?: Adequate participation with social networks  Intimate Partner Violence: Not At Risk (06/23/2022)   Received from [facility], [facility]   HITS    Over the last 12 months how often did your partner physically hurt you?: 1    Over the last 12 months how often did your partner insult you or talk down to you?: 1    Over the last 12 months how often did your partner threaten you with physical harm?: 1    Over the last 12 months how often did your partner scream or curse at you?: 1    Review of Systems:  All other review of systems negative except as mentioned in the HPI.  Physical Exam: Vital signs BP 106/87   Pulse (!) 54   Temp (!) 96.9 F (36.1 C)   Resp 15   Ht 4\' 10"  (1.473 m)   Wt 131 lb (59.4 kg)   SpO2 100%   BMI 27.38 kg/m   General:   Alert,  Well-developed, well-nourished, pleasant and cooperative in NAD Lungs:  Clear throughout to auscultation.   Heart:  Regular rate and rhythm; no murmurs, clicks, rubs,  or gallops. Abdomen:  Soft, nontender and nondistended. Normal bowel sounds.   Neuro/Psych:  Alert and cooperative. Normal mood and affect. A and O x 3   @Albaro Deviney  Sena Slate, MD, [facility] [phone] (pager) 08/07/2022 9:00 AM@

## 2022-08-07 NOTE — Patient Instructions (Addendum)
No polyps today.  You do have diverticulosis - thickened muscle rings and pouches in the colon wall. Please read the handout about this condition.  Hemorrhoids were also a little swollen, which happens from the prep.  Your next routine colonoscopy should be in 5 years - 2029.   - Resume previous diet. - Continue present medications. - Repeat colonoscopy in 5 years for surveillance.   YOU HAD AN ENDOSCOPIC PROCEDURE TODAY AT THE [facility]:   Refer to the procedure report that was given to you for any specific questions about what was found during the examination.  If the procedure report does not answer your questions, please call your gastroenterologist to clarify.  If you requested that your care partner not be given the details of your procedure findings, then the procedure report has been included in a sealed envelope for you to review at your convenience later.  YOU SHOULD EXPECT: Some feelings of bloating in the abdomen. Passage of more gas than usual.  Walking can help get rid of the air that was put into your GI tract during the procedure and reduce the bloating. If you had a lower endoscopy (such as a colonoscopy or flexible sigmoidoscopy) you may notice spotting of blood in your stool or on the toilet paper. If you underwent a bowel prep for your procedure, you may not have a normal bowel movement for a few days.  Please Note:  You might notice some irritation and congestion in your nose or some drainage.  This is from the oxygen used during your procedure.  There is no need for concern and it should clear up in a day or so.  SYMPTOMS TO REPORT IMMEDIATELY:  Following lower endoscopy (colonoscopy or flexible sigmoidoscopy):  Excessive amounts of blood in the stool  Significant tenderness or worsening of abdominal pains  Swelling of the abdomen that is new, acute  Fever of 100F or higher  For urgent or emergent issues, a gastroenterologist can be reached at any  hour by calling [phone]. Do not use MyChart messaging for urgent concerns.    DIET:  We do recommend a small meal at first, but then you may proceed to your regular diet.  Drink plenty of fluids but you should avoid alcoholic beverages for 24 hours.  ACTIVITY:  You should plan to take it easy for the rest of today and you should NOT DRIVE or use heavy machinery until tomorrow (because of the sedation medicines used during the test).    FOLLOW UP: Our staff will call the number listed on your records the next business day following your procedure.  We will call around 7:15- 8:00 am to check on you and address any questions or concerns that you may have regarding the information given to you following your procedure. If we do not reach you, we will leave a message.     If any biopsies were taken you will be contacted by phone or by letter within the next 1-3 weeks.  Please call us at [phone] if you have not heard about the biopsies in 3 weeks.    SIGNATURES/CONFIDENTIALITY: You and/or your care partner have signed paperwork which will be entered into your electronic medical record.  These signatures attest to the fact that that the information above on your After Visit Summary has been reviewed and is understood.  Full responsibility of the confidentiality of this discharge information lies with you and/or your care-partner.

## 2022-08-07 NOTE — Op Note (Signed)
[facility] Patient Name: Deborah Ramirez Procedure Date: 08/07/2022 8:41 AM MRN: [id number] Endoscopist: Iva Boop , MD, [id number] Age: 57 Referring MD:  Date of Birth: 1965-04-29 Gender: Female Account #: [id number] Procedure:                Colonoscopy Indications:              Surveillance: Personal history of adenomatous                            polyps on last colonoscopy 5 years ago, Last                            colonoscopy: May 2019 Also FHx CRCA multiple second                            degree relatives Medicines:                Monitored Anesthesia Care Procedure:                Pre-Anesthesia Assessment:                           - Prior to the procedure, a History and Physical                            was performed, and patient medications and                            allergies were reviewed. The patient's tolerance of                            previous anesthesia was also reviewed. The risks                            and benefits of the procedure and the sedation                            options and risks were discussed with the patient.                            All questions were answered, and informed consent                            was obtained. Prior Anticoagulants: The patient has                            taken no anticoagulant or antiplatelet agents. ASA                            Grade Assessment: II - A patient with mild systemic                            disease. After reviewing the risks and benefits,  the patient was deemed in satisfactory condition to                            undergo the procedure.                           After obtaining informed consent, the colonoscope                            was passed under direct vision. Throughout the                            procedure, the patient's blood pressure, pulse, and                            oxygen saturations were monitored continuously.  The                            Olympus Scope [id number] was introduced through the                            anus and advanced to the the cecum, identified by                            appendiceal orifice and ileocecal valve. The                            colonoscopy was performed without difficulty. The                            patient tolerated the procedure well. The quality                            of the bowel preparation was good. The ileocecal                            valve, appendiceal orifice, and rectum were                            photographed. Scope In: 9:05:03 AM Scope Out: 9:17:21 AM Scope Withdrawal Time: 0 hours 9 minutes 24 seconds  Total Procedure Duration: 0 hours 12 minutes 18 seconds  Findings:                 The perianal and digital rectal examinations were                            normal.                           Multiple diverticula were found in the sigmoid                            colon.  Internal hemorrhoids were found. The hemorrhoids                            were small.                           The exam was otherwise without abnormality on                            direct and retroflexion views. Complications:            No immediate complications. Estimated Blood Loss:     Estimated blood loss: none. Impression:               - Diverticulosis in the sigmoid colon.                           - Internal hemorrhoids.                           - The examination was otherwise normal on direct                            and retroflexion views.                           - No specimens collected.                           - Personal history of colonic polyps. 2 diminutive                            adenomas 05/2017 + FHx CRCA paternal grandfather,                            paternal aunt + cousin Recommendation:           - Patient has a contact number available for                            emergencies. The signs and  symptoms of potential                            delayed complications were discussed with the                            patient. Return to normal activities tomorrow.                            Written discharge instructions were provided to the                            patient.                           - Resume previous diet.                           -  Continue present medications.                           - Repeat colonoscopy in 5 years for surveillance. Iva Boop, MD 08/07/2022 9:27:31 AM This report has been signed electronically.

## 2022-08-08 ENCOUNTER — Telehealth: Payer: Self-pay

## 2022-08-08 NOTE — Telephone Encounter (Signed)
  Follow up Call-     08/07/2022    7:54 AM  Call back number  Post procedure Call Back phone  # [phone]  Permission to leave phone message Yes     Patient questions:  Do you have a fever, pain , or abdominal swelling? No. Pain Score  0 *  Have you tolerated food without any problems? Yes.    Have you been able to return to your normal activities? Yes.    Do you have any questions about your discharge instructions: Diet   No. Medications  No. Follow up visit  No.  Do you have questions or concerns about your Care? No.  Actions: * If pain score is 4 or above: No action needed, pain <4.

## 2023-02-20 ENCOUNTER — Other Ambulatory Visit: Payer: Self-pay | Admitting: Physician Assistant

## 2023-02-20 DIAGNOSIS — Z1231 Encounter for screening mammogram for malignant neoplasm of breast: Secondary | ICD-10-CM

## 2023-04-16 ENCOUNTER — Ambulatory Visit
Admission: RE | Admit: 2023-04-16 | Discharge: 2023-04-16 | Disposition: A | Discharge status: Home / Self Care | Payer: 59 | Source: Ambulatory Visit | Attending: Physician Assistant | Admitting: Physician Assistant

## 2023-04-16 DIAGNOSIS — Z1231 Encounter for screening mammogram for malignant neoplasm of breast: Secondary | ICD-10-CM

## 2024-02-06 ENCOUNTER — Other Ambulatory Visit: Payer: Self-pay | Admitting: Physician Assistant

## 2024-02-06 DIAGNOSIS — Z1231 Encounter for screening mammogram for malignant neoplasm of breast: Secondary | ICD-10-CM

## 2024-04-16 ENCOUNTER — Ambulatory Visit
